# Patient Record
Sex: Female | Born: 1970 | Race: White | Hispanic: No | Marital: Married | Smoking: Never smoker
Health system: Southern US, Community
[De-identification: ages and names within clinical notes are randomized; demographics above are authoritative.]

## PROBLEM LIST (undated history)

## (undated) DIAGNOSIS — G5620 Lesion of ulnar nerve, unspecified upper limb: Secondary | ICD-10-CM

## (undated) DIAGNOSIS — D649 Anemia, unspecified: Secondary | ICD-10-CM

## (undated) DIAGNOSIS — F319 Bipolar disorder, unspecified: Secondary | ICD-10-CM

## (undated) DIAGNOSIS — R000 Tachycardia, unspecified: Secondary | ICD-10-CM

## (undated) DIAGNOSIS — E785 Hyperlipidemia, unspecified: Secondary | ICD-10-CM

## (undated) DIAGNOSIS — F329 Major depressive disorder, single episode, unspecified: Secondary | ICD-10-CM

## (undated) DIAGNOSIS — G5610 Other lesions of median nerve, unspecified upper limb: Secondary | ICD-10-CM

## (undated) DIAGNOSIS — E28319 Asymptomatic premature menopause: Secondary | ICD-10-CM

## (undated) DIAGNOSIS — F419 Anxiety disorder, unspecified: Secondary | ICD-10-CM

## (undated) DIAGNOSIS — B019 Varicella without complication: Secondary | ICD-10-CM

## (undated) DIAGNOSIS — M5412 Radiculopathy, cervical region: Secondary | ICD-10-CM

## (undated) DIAGNOSIS — E860 Dehydration: Secondary | ICD-10-CM

## (undated) DIAGNOSIS — F32A Depression, unspecified: Secondary | ICD-10-CM

## (undated) HISTORY — DX: Radiculopathy, cervical region: M54.12

## (undated) HISTORY — DX: Lesion of ulnar nerve, unspecified upper limb: G56.20

## (undated) HISTORY — DX: Depression, unspecified: F32.A

## (undated) HISTORY — DX: Anemia, unspecified: D64.9

## (undated) HISTORY — DX: Tachycardia, unspecified: R00.0

## (undated) HISTORY — DX: Asymptomatic premature menopause: E28.319

## (undated) HISTORY — DX: Hyperlipidemia, unspecified: E78.5

## (undated) HISTORY — DX: Dehydration: E86.0

## (undated) HISTORY — PX: UPPER GASTROINTESTINAL ENDOSCOPY: SHX188

## (undated) HISTORY — PX: BUNIONECTOMY: SHX129

## (undated) HISTORY — PX: APPENDECTOMY: SHX54

## (undated) HISTORY — DX: Varicella without complication: B01.9

## (undated) HISTORY — DX: Other lesions of median nerve, unspecified upper limb: G56.10

## (undated) HISTORY — DX: Anxiety disorder, unspecified: F41.9

## (undated) HISTORY — DX: Bipolar disorder, unspecified: F31.9

---
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
## 1990-11-06 HISTORY — PX: SHOULDER SURGERY: SHX246

## 2000-09-12 ENCOUNTER — Encounter (INDEPENDENT_AMBULATORY_CARE_PROVIDER_SITE_OTHER): Payer: Self-pay | Admitting: Specialist

## 2000-09-12 ENCOUNTER — Ambulatory Visit (HOSPITAL_COMMUNITY): Admission: RE | Admit: 2000-09-12 | Discharge: 2000-09-12 | Payer: Self-pay | Admitting: Gastroenterology

## 2000-11-06 HISTORY — PX: FOOT SURGERY: SHX648

## 2002-03-18 ENCOUNTER — Other Ambulatory Visit: Admission: RE | Admit: 2002-03-18 | Discharge: 2002-03-18 | Payer: Self-pay | Admitting: Obstetrics and Gynecology

## 2003-01-22 ENCOUNTER — Inpatient Hospital Stay (HOSPITAL_COMMUNITY): Admission: AD | Admit: 2003-01-22 | Discharge: 2003-01-22 | Payer: Self-pay | Admitting: Obstetrics and Gynecology

## 2003-02-16 ENCOUNTER — Inpatient Hospital Stay (HOSPITAL_COMMUNITY): Admission: AD | Admit: 2003-02-16 | Discharge: 2003-02-19 | Payer: Self-pay | Admitting: Obstetrics and Gynecology

## 2003-02-16 ENCOUNTER — Encounter (INDEPENDENT_AMBULATORY_CARE_PROVIDER_SITE_OTHER): Payer: Self-pay

## 2003-02-20 ENCOUNTER — Encounter: Admission: RE | Admit: 2003-02-20 | Discharge: 2003-03-22 | Payer: Self-pay | Admitting: Obstetrics and Gynecology

## 2003-03-30 ENCOUNTER — Other Ambulatory Visit: Admission: RE | Admit: 2003-03-30 | Discharge: 2003-03-30 | Payer: Self-pay | Admitting: Obstetrics and Gynecology

## 2003-11-23 ENCOUNTER — Ambulatory Visit (HOSPITAL_COMMUNITY): Admission: RE | Admit: 2003-11-23 | Discharge: 2003-11-23 | Payer: Self-pay | Admitting: Gastroenterology

## 2003-11-27 ENCOUNTER — Ambulatory Visit (HOSPITAL_COMMUNITY): Admission: RE | Admit: 2003-11-27 | Discharge: 2003-11-27 | Payer: Self-pay | Admitting: Gastroenterology

## 2003-12-01 ENCOUNTER — Ambulatory Visit (HOSPITAL_COMMUNITY): Admission: RE | Admit: 2003-12-01 | Discharge: 2003-12-01 | Payer: Self-pay | Admitting: Gastroenterology

## 2004-01-01 ENCOUNTER — Ambulatory Visit (HOSPITAL_COMMUNITY): Admission: RE | Admit: 2004-01-01 | Discharge: 2004-01-01 | Payer: Self-pay | Admitting: General Surgery

## 2004-02-22 ENCOUNTER — Inpatient Hospital Stay (HOSPITAL_COMMUNITY): Admission: RE | Admit: 2004-02-22 | Discharge: 2004-03-10 | Payer: Self-pay | Admitting: Psychiatry

## 2004-03-14 ENCOUNTER — Other Ambulatory Visit (HOSPITAL_COMMUNITY): Admission: RE | Admit: 2004-03-14 | Discharge: 2004-03-28 | Payer: Self-pay | Admitting: Psychiatry

## 2004-05-12 ENCOUNTER — Encounter (INDEPENDENT_AMBULATORY_CARE_PROVIDER_SITE_OTHER): Payer: Self-pay | Admitting: Specialist

## 2004-05-12 ENCOUNTER — Ambulatory Visit (HOSPITAL_COMMUNITY): Admission: RE | Admit: 2004-05-12 | Discharge: 2004-05-13 | Payer: Self-pay | Admitting: General Surgery

## 2004-07-21 ENCOUNTER — Other Ambulatory Visit: Admission: RE | Admit: 2004-07-21 | Discharge: 2004-07-21 | Payer: Self-pay | Admitting: Obstetrics and Gynecology

## 2004-09-20 ENCOUNTER — Ambulatory Visit (HOSPITAL_COMMUNITY): Admission: RE | Admit: 2004-09-20 | Discharge: 2004-09-20 | Payer: Self-pay | Admitting: Cardiology

## 2004-11-06 HISTORY — PX: CHOLECYSTECTOMY: SHX55

## 2005-02-16 ENCOUNTER — Inpatient Hospital Stay (HOSPITAL_COMMUNITY): Admission: AD | Admit: 2005-02-16 | Discharge: 2005-02-16 | Payer: Self-pay | Admitting: Obstetrics and Gynecology

## 2005-03-27 ENCOUNTER — Inpatient Hospital Stay (HOSPITAL_COMMUNITY): Admission: AD | Admit: 2005-03-27 | Discharge: 2005-03-27 | Payer: Self-pay | Admitting: Obstetrics and Gynecology

## 2005-04-07 ENCOUNTER — Inpatient Hospital Stay (HOSPITAL_COMMUNITY): Admission: AD | Admit: 2005-04-07 | Discharge: 2005-04-08 | Payer: Self-pay | Admitting: Obstetrics & Gynecology

## 2005-05-05 ENCOUNTER — Inpatient Hospital Stay (HOSPITAL_COMMUNITY): Admission: AD | Admit: 2005-05-05 | Discharge: 2005-05-05 | Payer: Self-pay | Admitting: Obstetrics & Gynecology

## 2005-05-11 ENCOUNTER — Inpatient Hospital Stay (HOSPITAL_COMMUNITY): Admission: RE | Admit: 2005-05-11 | Discharge: 2005-05-13 | Payer: Self-pay | Admitting: Obstetrics and Gynecology

## 2005-06-23 ENCOUNTER — Other Ambulatory Visit: Admission: RE | Admit: 2005-06-23 | Discharge: 2005-06-23 | Payer: Self-pay | Admitting: Obstetrics and Gynecology

## 2006-11-13 ENCOUNTER — Ambulatory Visit: Payer: Self-pay | Admitting: Family Medicine

## 2007-07-04 ENCOUNTER — Telehealth (INDEPENDENT_AMBULATORY_CARE_PROVIDER_SITE_OTHER): Payer: Self-pay | Admitting: *Deleted

## 2007-07-05 ENCOUNTER — Ambulatory Visit: Payer: Self-pay | Admitting: Family Medicine

## 2007-09-30 ENCOUNTER — Telehealth (INDEPENDENT_AMBULATORY_CARE_PROVIDER_SITE_OTHER): Payer: Self-pay | Admitting: *Deleted

## 2008-11-06 HISTORY — PX: BREAST LUMPECTOMY: SHX2

## 2009-12-07 LAB — HM MAMMOGRAPHY

## 2009-12-17 ENCOUNTER — Encounter: Admission: RE | Admit: 2009-12-17 | Discharge: 2009-12-17 | Payer: Self-pay | Admitting: Obstetrics and Gynecology

## 2010-01-07 ENCOUNTER — Encounter: Admission: RE | Admit: 2010-01-07 | Discharge: 2010-01-07 | Payer: Self-pay | Admitting: General Surgery

## 2010-01-07 ENCOUNTER — Ambulatory Visit (HOSPITAL_BASED_OUTPATIENT_CLINIC_OR_DEPARTMENT_OTHER): Admission: RE | Admit: 2010-01-07 | Discharge: 2010-01-07 | Payer: Self-pay | Admitting: General Surgery

## 2010-11-06 HISTORY — PX: TONSILLECTOMY AND ADENOIDECTOMY: SUR1326

## 2010-11-29 ENCOUNTER — Other Ambulatory Visit: Payer: Self-pay | Admitting: Obstetrics and Gynecology

## 2010-11-29 DIAGNOSIS — Z1239 Encounter for other screening for malignant neoplasm of breast: Secondary | ICD-10-CM

## 2010-11-29 DIAGNOSIS — Z1231 Encounter for screening mammogram for malignant neoplasm of breast: Secondary | ICD-10-CM

## 2010-12-14 ENCOUNTER — Ambulatory Visit: Payer: Self-pay

## 2010-12-15 ENCOUNTER — Ambulatory Visit
Admission: RE | Admit: 2010-12-15 | Discharge: 2010-12-15 | Disposition: A | Discharge status: Home / Self Care | Payer: BC Managed Care – PPO | Source: Ambulatory Visit | Attending: Obstetrics and Gynecology | Admitting: Obstetrics and Gynecology

## 2010-12-15 DIAGNOSIS — Z1231 Encounter for screening mammogram for malignant neoplasm of breast: Secondary | ICD-10-CM

## 2011-01-29 LAB — CBC
Hemoglobin: 13.2 g/dL (ref 12.0–15.0)
Platelets: 243 10*3/uL (ref 150–400)
RDW: 14.1 % (ref 11.5–15.5)

## 2011-01-29 LAB — DIFFERENTIAL
Basophils Absolute: 0 10*3/uL (ref 0.0–0.1)
Lymphocytes Relative: 33 % (ref 12–46)
Monocytes Absolute: 0.5 10*3/uL (ref 0.1–1.0)
Neutro Abs: 3.5 10*3/uL (ref 1.7–7.7)
Neutrophils Relative %: 57 % (ref 43–77)

## 2011-01-29 LAB — BASIC METABOLIC PANEL
Calcium: 9.6 mg/dL (ref 8.4–10.5)
Creatinine, Ser: 1.01 mg/dL (ref 0.4–1.2)
GFR calc non Af Amer: >60 mL/min (ref >60)
Glucose, Bld: 96 mg/dL (ref 70–99)
Sodium: 137 mEq/L (ref 135–145)

## 2011-01-31 ENCOUNTER — Other Ambulatory Visit (INDEPENDENT_AMBULATORY_CARE_PROVIDER_SITE_OTHER): Payer: Self-pay | Admitting: Otolaryngology

## 2011-01-31 ENCOUNTER — Ambulatory Visit (HOSPITAL_BASED_OUTPATIENT_CLINIC_OR_DEPARTMENT_OTHER)
Admission: RE | Admit: 2011-01-31 | Discharge: 2011-01-31 | Disposition: A | Discharge status: Home / Self Care | Payer: BC Managed Care – PPO | Source: Ambulatory Visit | Attending: Otolaryngology | Admitting: Otolaryngology

## 2011-01-31 DIAGNOSIS — J3501 Chronic tonsillitis: Secondary | ICD-10-CM | POA: Insufficient documentation

## 2011-01-31 LAB — POCT HEMOGLOBIN-HEMACUE: Hemoglobin: 14.2 g/dL (ref 12.0–15.0)

## 2011-02-15 NOTE — Op Note (Signed)
NAMESARANN, TREGRE                  ACCOUNT NO.:  [id number]  MEDICAL RECORD NO.:  [id number]            PATIENT TYPE:  LOCATION:                                 FACILITY:  PHYSICIAN:  Newman Pies, MD                 DATE OF BIRTH:  DATE OF PROCEDURE:  01/31/2011 DATE OF DISCHARGE:                              OPERATIVE REPORT   SURGEON:  Newman Pies, MD  PREOPERATIVE DIAGNOSES: 1. Chronic tonsillitis/pharyngitis. 2. Tonsillar hypertrophy.  POSTOPERATIVE DIAGNOSES: 1. Chronic tonsillitis/pharyngitis. 2. Adenotonsillar hypertrophy.  PROCEDURE PERFORMED:  Adenotonsillectomy  ANESTHESIA:  General endotracheal tube anesthesia.  COMPLICATIONS:  None.  ESTIMATED BLOOD LOSS:  Minimal.  INDICATIONS FOR PROCEDURE:  The patient is a 40 year old female with a history of chronic sore throat and frequent tonsillolith production. The patient also complains of chronic halitosis.  The patient was previously diagnosed as having multiple recurrent strep infections and was treated with multiple courses of antibiotics over the past year.  On examination, the patient was noted to have bilateral cryptic tonsils, with numerous tonsilloliths within the tonsillar fossa.  Based on the above findings, the decision was made for the patient to undergo tonsillectomy and possible adenoidectomy.  The risks, benefits, alternatives, and details of the procedure were discussed with the patient.  Questions were invited and answered.  Informed consent was obtained.  DESCRIPTION:  The patient was taken to the operating room and placed supine on the operating table.  General endotracheal tube anesthesia was administered by the anesthesiologist.  The patient was positioned and prepped and draped in a standard fashion for tonsillectomy.  A Crowe- Davis mouth gag was inserted into the oral cavity for exposure.  2+ cryptic tonsils were noted bilaterally.  No submucous cleft or bifidity was noted.  Indirect mirror  examination of the nasal pharynx revealed moderate residual adenoid tissue.  The adenoid was completely ablated with the coblator device.  Hemostasis was also achieved with the coblator device.  The right tonsil was then grasped with a straight Allis clamp and retracted medially.  It was resected free from the underlying pharyngeal constrictor muscles with the coblator device.  The same procedure was repeated on the left side without exception.  The surgical sites were copiously irrigated.  The care of the patient was turned over to the mouth by anesthesiologist.  The patient was awakened from anesthesia without difficulty.  She was extubated and transferred to the recovery room in good condition.  OPERATIVE FINDINGS:  Adenotonsillar hypertrophy, with numerous tonsilloliths within the tonsillar fossa.  SPECIMEN:  Bilateral tonsils.  FOLLOWUP CARE:  The patient will be discharged home once she is awake and alert.  She will be placed on Roxicet 5-10 mL p.o. q.4-6 h. p.r.n. pain, and amoxicillin 600 mg p.o. b.i.d. for 5 days.  The patient will follow up in my office in approximately 2 weeks.     Newman Pies, MD     ST/MEDQ  D:  01/31/2011  T:  02/01/2011  Job:  [id number]  Electronically Signed by Newman Pies MD on  02/15/2011 11:32:59 AM

## 2011-03-24 NOTE — Op Note (Signed)
[place]  Patient:    Alison Bradley, Alison Bradley                           MRN: [id number] Proc. Date: 09/12/00 Adm. Date:  [id number] Attending:  Charna Elizabeth                           Operative Report  DATE OF BIRTH:  1971/10/23  EFERRING PHYSICIAN:  PROCEDURE PERFORMED:  Esophagogastroduodenoscopy with biopsies.  ENDOSCOPIST:  Anselmo Rod, M.D.  INSTRUMENT USED:  Olympus video panendoscope.  INDICATIONS FOR PROCEDURE:  History of melenic stools in a 40 year old white female rule out peptic ulcer disease, esophagitis, gastritis, etc.  PREPROCEDURE PREPARATION:  Informed consent was procured from the patient. The patient was fasted for eight hours prior to the procedure.  PREPROCEDURE PHYSICAL:  The patient had stable vital signs.  Neck supple. Chest clear to auscultation.  S1, S2 regular.  Abdomen soft with normal abdominal bowel sounds.  DESCRIPTION OF PROCEDURE:  The patient was placed in left lateral decubitus position and sedated with 50 mg of Demerol and 6 mg of Versed intravenously. Once the patient was adequately sedated and maintained on low-flow oxygen and continuous cardiac monitoring, the Olympus video panendoscope was advanced through the mouthpiece, over the tongue, into the esophagus under direct vision.  The entire esophagus appeared normal without evidence of ring, stricture, masses, lesions, esophagitis or Barretts mucosa.  The scope was then advanced to the stomach.  A small hiatal hernia was seen in high retroflexion.  There was ____________ gastritis with areas of erythema in the midbody of the stomach.  Biopsies were done from this area to rule out presence of Helicobacter pylori by pathology.  The proximal small bowel appeared normal.  No frank ulcers were seen.  A small amount of liquid debris in the stomach even though the patient had consumed her last meal over 12 hours ago.  IMPRESSION: 1. Patchy gastritis, biopsies  done for Helicobacter pylori. 2. Small hiatal hernia. 3. Normal esophagus and proximal bowel. 4. No ulcers seen.  RECOMMENDATION: 1. Continue Nexium for now. 2. Treat with antibiotics if Helicobacter pylori present on biopsies. 3. Avoid all nonsteroidals. 4. Outpatient follow-up in the next two weeks. DD:  09/13/00 TD:  09/13/00 Job: [id number]

## 2011-03-24 NOTE — H&P (Signed)
Alison Bradley, Alison Bradley                              ACCOUNT NO.:  [id number]   MEDICAL RECORD NO.:  [id number]                   PATIENT TYPE:  OIB   LOCATION:  2550                                 FACILITY:  [facility]   PHYSICIAN:  Adolph Pollack, M.D.            DATE OF BIRTH:  June 05, 1971   DATE OF ADMISSION:  05/12/2004  DATE OF DISCHARGE:                                HISTORY & PHYSICAL   REASON FOR ADMISSION:  Elective cholecystectomy.   HISTORY OF PRESENT ILLNESS:  This is a 40 year old female who I first saw  back in February at the request of Dr. Anselmo Rod.  She had epigastric  pain, bloating, and she was placed on Aciphex and Reglan but the pain  continued to come.  Gallbladder ultrasound was normal.  A nuclear medicine  hepatobiliary scan with ejection fraction demonstrates a borderline  gallbladder ejection fraction of 30% which is low normal.  A gastric  emptying scan was somewhat delayed.  She has been monitored and I saw her a  month later.  She continued to have postprandial epigastric pain and nausea.   We discussed the laparoscopic cholecystectomy being done for gallbladder  dysfunction and I went over the procedure and the risks.  In the interim,  she had an acute exacerbation of her bipolar disorder requiring  hospitalization but now has improved after medication and presents for her  elective procedure.   PAST MEDICAL HISTORY:  Bipolar disorder.   PAST SURGICAL HISTORY:  1. Cesarean section.  2. Left shoulder surgery.  3. Left wrist surgery.   ALLERGIES:  CT DYE.   MEDICATIONS:  1. Lithium 300 mg q.a.m. and 600 mg q.p.m.  2. Seroquel 100 mg t.i.d. and 200 mg q.h.s.  3. Wellbutrin 150 mg q.a.m.  4. Trileptal 100 mg q.a.m.  5. Lamictal 50 mg q.a.m.   SOCIAL HISTORY:  She is married.  Denies tobacco use.  Has one or two drinks  a month.  She works at [employer].   PHYSICAL EXAMINATION:  GENERAL:  A well-developed, well-nourished  female in  no acute distress.  Pleasant and cooperative.  VITAL SIGNS:  Temperature is 97.5, blood pressure 115/70, pulse 87.  HEENT:  Extraocular movements intact.  No icterus.  NECK:  Supple without obvious palpable masses.  LUNGS:  Breath sounds equal and clear.  CARDIOVASCULAR:  Heart demonstrates a regular rate and rhythm.  No murmur  heard.  EXTREMITIES:  No lower extremity edema.  ABDOMEN:  Soft, nontender, and nondistended.  Lower transverse scar noted.   IMPRESSION:  Gallbladder dysfunction/biliary dyskinesia.   PLAN:  Laparoscopic cholecystectomy.  Adolph Pollack, M.D.    Kari Baars  D:  05/12/2004  T:  05/12/2004  Job:  [id number]

## 2011-03-24 NOTE — Op Note (Signed)
Alison Bradley, Alison Bradley                              ACCOUNT NO.:  [id number]   MEDICAL RECORD NO.:  [id number]                   PATIENT TYPE:  AMB   LOCATION:  ENDO                                 FACILITY:  [facility]   PHYSICIAN:  Anselmo Rod, M.D.               DATE OF BIRTH:  [birth year]   DATE OF PROCEDURE:  11/23/2003  DATE OF DISCHARGE:  11/23/2003                                 OPERATIVE REPORT   PROCEDURE PERFORMED:  Esophagogastroduodenoscopy.   ENDOSCOPIST:  Charna Elizabeth, M.D.   INSTRUMENT USED:  Olympus video panendoscope.   INDICATIONS FOR PROCEDURE:  Epigastric pain with nausea and vomiting in a 40-  year-old white female, rule out peptic ulcer disease, esophagitis,  gastritis, etc.   PREPROCEDURE PREPARATION:  Informed consent was procured from the patient.  The patient was fasted for eight hours prior to the procedure.   PREPROCEDURE PHYSICAL:  The patient had stable vital signs.  Neck supple,  chest clear to auscultation.  S1, S2 regular.  Abdomen soft with normal  bowel sounds.  Epigastric tenderness on palpation, no guarding rebound, no  rigidity, no hepatosplenomegaly.   DESCRIPTION OF PROCEDURE:  The patient was placed in the left lateral  decubitus position and sedated with 80 mg of Demerol and 8 mg of Versed  intravenously.  Once the patient was adequately sedated and maintained on  low-flow oxygen and continuous cardiac monitoring, the Olympus video  panendoscope was advanced through the mouth piece over the tongue into the  esophagus under direct vision.  The entire esophagus appeared normal with no  evidence of ring, stricture, masses, esophagitis or Barrett's mucosa.  The  scope was then advanced to the stomach.  There was a large amount of debris  seen in the antrum which may indicate a component of gastroparesis.  Retroflexion in the high cardia revealed no abnormalities.  Proximal small  bowel appeared normal.  No erosions, ulcerations, masses or polyps  were  seen.  Visualization of the mucosa underlying the debris in the stomach was  not possible in spite of several efforts to do so.  The patient tolerated  the procedure well without complications.  There was no evidence of outlet  obstruction.   IMPRESSION:  1. Normal-appearing esophagus and proximal small bowel.  2. Large amount of debris in the antrum, question gastroparesis.   RECOMMENDATIONS:  1. Schedule ultrasound and HIDA scan with CCK injection and a gastric     emptying study to further evaluate her     symptoms.  2. Continue proton pump inhibitors for now.  Outpatient followup after the     above mentioned test results have been procured.  Anselmo Rod, M.D.    JNM/MEDQ  D:  12/29/2003  T:  12/29/2003  Job:  [id number]   cc:   Leanne Chang, M.D.  [address]  [place]  [place] [address]  Fax: [phone]

## 2011-03-24 NOTE — Op Note (Signed)
Alison Bradley, Alison Bradley                              ACCOUNT NO.:  [id number]   MEDICAL RECORD NO.:  [id number]                   PATIENT TYPE:  OIB   LOCATION:  5707                                 FACILITY:  [facility]   PHYSICIAN:  Adolph Pollack, M.D.            DATE OF BIRTH:  December 08, 1970   DATE OF PROCEDURE:  05/12/2004  DATE OF DISCHARGE:                                 OPERATIVE REPORT   PREOPERATIVE DIAGNOSIS:  Biliary dyskinesia.   POSTOPERATIVE DIAGNOSIS:  Biliary dyskinesia.   OPERATION PERFORMED:  Laparoscopic cholecystectomy with intraoperative  cholangiogram.   SURGEON:  Adolph Pollack, M.D.   ANESTHESIA:  General.   ASSISTANT:  Abigail Miyamoto, M.D.   INDICATIONS FOR PROCEDURE:  This is a 40 year old female with postprandial  epigastric pain, nausea and vomiting and a borderline ejection fraction.  She now presents for elective cholecystectomy for gallbladder dysfunction.  The procedure and the risks have been discussed with her preoperatively.   DESCRIPTION OF PROCEDURE:  She was seen in the holding area, then brought to  the operating room and placed supine on the operating table and a general  anesthetic was administered.  Her abdominal wall was then sterilely prepped  and draped.  Dilute Marcaine solution was then infiltrated for local  anesthetic in the subumbilical region and a subumbilical incision was made  through the skin and subcutaneous tissue, fascia and peritoneum under direct  vision.  A pursestring suture of 0 Vicryl was placed around the fascial  edges.  A Hasson trocar was introduced into the peritoneal cavity.  Pneumoperitoneum was created by insufflation with CO2 gas.   Next, a laparoscope was introduced and she was placed in reverse  Trendelenburg position with the right side tilted slightly upward.  An 11 mm  trocar was placed through an epigastric incision and two 5 mm trocars placed  in the right midabdomen.  The fundus of the gallbladder  was grasped.  No  acute inflammatory changes or dense adhesions were noted.  The fundus was  retracted toward the right shoulder.  The infundibulum was grasped and  mobilized bluntly and using slight cautery.  I isolated the cystic duct and  created a window around it.  A clip was placed just above the cystic duct  gallbladder junction and a small incision made in the cystic duct.  A  cholangiocatheter was passed through the anterior abdominal wall into the  cystic duct and the cholangiogram was performed.   Under real time fluoroscopy, dilute contrast material was injected into the  cystic duct which was of moderate length.  The common hepatic, right and  left hepatic and common bile ducts all filled and common bile duct emptied,  drained the contrast promptly to the duodenum without obvious evidence of  obstruction.  Final reports pending radiologist's interpretation.  Cholangiocatheter was removed, the cystic duct was then clipped three times  on the  staying end side and divided.  The anterior branch of the cystic  artery was clipped and divided and then the posterior branch was identified,  clipped and divided.  The gallbladder was dissected free from the liver bed  intact with electrocautery.  It was then removed through the subumbilical  port.  Under laparoscopic vision, the subumbilical fascial defect was closed  by tightening up and tying down the pursestring suture.   The perihepatic area was irrigated, the gallbladder fossa irrigated, no  bleeding or bile leakage was noted.  The irrigating fluid was evacuated.  The remaining trocars were then removed and the pneumoperitoneum was then  released.  The skin incisions were closed with 4-0 Monocryl subcuticular  stitches.  Steri-Strips and sterile dressings were applied.  The patient  tolerated the procedure well without any apparent complications and was  taken to the recovery room in satisfactory condition.                                                Adolph Pollack, M.D.    Kari Baars  D:  05/12/2004  T:  05/12/2004  Job:  [id number]   cc:   Anselmo Rod, M.D.  [address]  [address]  [place]  [place] [address]  Fax: [phone]   Leanne Chang, M.D.  [address]  [place]  [place] [address]  Fax: [phone]

## 2011-03-24 NOTE — Op Note (Signed)
Alison Bradley, FLORENCE                              ACCOUNT NO.:  [id number]   MEDICAL RECORD NO.:  [id number]                   PATIENT TYPE:  INP   LOCATION:  9109                                 FACILITY:  [facility]   PHYSICIAN:  Juluis Mire, M.D.                DATE OF BIRTH:  Oct 31, 1971   DATE OF PROCEDURE:  02/16/2003  DATE OF DISCHARGE:                                 OPERATIVE REPORT   PREOPERATIVE DIAGNOSES:  Intrauterine pregnancy at 36-1/2 weeks with  spontaneous onset of labor and breech presentation.   POSTOPERATIVE DIAGNOSES:  Intrauterine pregnancy at 36-1/2 weeks with  spontaneous onset of labor and breech presentation.   PROCEDURE:  Low transverse cesarean section.   SURGEON:  Juluis Mire, M.D.   ANESTHESIA:  Spinal.   ESTIMATED BLOOD LOSS:  600 mL.   PACKS AND DRAINS:  None.   INTRAOPERATIVE BLOOD REPLACEMENT:  None.   COMPLICATIONS:  None.   INDICATIONS:  The patient is a 40 year old primigravida married white  female, who presents at 36+ weeks with regular uterine activity.  Documented  cervical change was noted.  Ultrasound did reveal a breech presentation.  The decision was to proceed with primary cesarean section.  The risks were  discussed, including fetal prematurity.  The risk of infection.  The  risk  of hemorrhage could necessitate transfusion with the risk of AIDS or  hepatitis.  Risk of injury to adjacent organs, including bladder, bowel, and  ureters and could require further exploratory surgery.  The risk of deep  venous thrombosis and pulmonary embolus.   PROCEDURE:  The patient was taken to the OR and placed in the supine  position with left lateral tilt.  After a satisfactory level of spinal  anesthesia was obtained, the abdomen was prepped with Betadine and draped in  a sterile field.  A low transverse skin incision was made with the knife and  carried through the subcutaneous tissue.  The fascia was entered sharply in  the incision and  the fascia extended laterally.  The fascia was taken off  the muscles superiorly and inferiorly.  The rectus muscles were separated in  the midline.  The anterior peritoneum was entered sharply.  The incision in  the peritoneum was extended both superiorly and inferiorly.  A low  transverse bladder flap was developed.  A low transverse uterine incision  was begun with a knife and was extended laterally using manual traction.  The infant presented in the classic breech presentation and was delivered in  the usual manner.  The infant was a viable female who weighed 6 lb, 10 oz.  Apgars were 7/9.  The umbilical cord pH was 7.31.  The placenta was  delivered manually.  The uterus was wiped clean of remaining membranes and  placenta.  The uterus was closed with interrupted sutures of 0 chromic in a  two-layer  closure technique.  We had good hemostasis and clear urine output.  The tubes and ovaries were unremarkable.  We irrigated the pelvis and noted  good hemostasis.  The muscles were reapproximated with running suture of 3-0  Vicryl.  The fascia was closed with running suture of 0 PDS.  The skin  was closed with staples and Steri-Strips.  Sponge, instrument, and needle  counts were reported as correct by circulating nurse x2.  The Foley catheter  remained clear at the time of closure.  The patient tolerated the procedure  well and was returned to the recovery room in good condition.                                               Juluis Mire, M.D.    JSM/MEDQ  D:  02/16/2003  T:  02/17/2003  Job:  [id number]

## 2011-03-24 NOTE — Discharge Summary (Signed)
NAME:  Alison Bradley, Alison Bradley                              ACCOUNT NO.:  [id number]   MEDICAL RECORD NO.:  [id number]                   PATIENT TYPE:  IPS   LOCATION:  0301                                 FACILITY:  [facility]   PHYSICIAN:  Jeanice Lim, M.D.              DATE OF BIRTH:  09/17/1971   DATE OF ADMISSION:  02/22/2004  DATE OF DISCHARGE:  03/10/2004                                 DISCHARGE SUMMARY   IDENTIFYING DATA:  This is a 40 year old married Caucasian female,  voluntarily admitted, with a history of bipolar disorder, brought to the  hospital by her husband, having erratic behavior, staying up at night,  wakening him to tell him that Prudy Feeler was in the house, crying, believes  supervisor has organized a conspiracy against her and working to put gas in  vents or poison in water.  Had thoughts of killing herself, feeling that she  is a bad mother and things are hopeless.   ADMISSION MEDICATIONS:  Seroquel, Xanax which she had not been compliant  with, and previously on Zyprexa, lithium, Depakote.   ALLERGIES:  SHELLFISH.  No known drug allergies.   PHYSICAL EXAMINATION:  Superficial scratches on left arm, otherwise  essentially within normal limits, neurologically nonfocal.   ROUTINE ADMISSION LABS:  Within normal limits.   MENTAL STATUS EXAM:  Fully alert, tearful, labile affect, grossly full motor  functions.  Speech hyperverbal, rapid pace.  Mood guarded, agitated, mixed  state.  Thought processes positive agitation, paranoid, suicidal ideation  with plan to cut off arm and bleed to death.  Cognitively intact.  Judgment  and insight poor.   ADMISSION DIAGNOSES:   AXIS I:  Bipolar disorder, mixed state, with psychotic features.   AXIS II:  Deferred.   AXIS III:  None.   AXIS IV:  Moderate stressors related to support group and occupation.   AXIS V:  25/75.   HOSPITAL COURSE:  The patient was admitted and ordered routine p.r.n.  medications, underwent further  monitoring, and was encouraged to participate  in individual, group and milieu therapy, was placed on safety checks, given  Geodon IM and then started on 60 mg b.i.d. as well as lithium due to  severity of psychosis.  The patient reported feeling quite delusional,  paranoid, agitated, labile, attempted to hurt herself, had to be placed on  one to one.  However she also reported wanting to be more gradual with  medications and felt over sedated and refused medications until they were  decreased and changed, therefore medications were changed despite the  severity of the patient's condition and the patient then a day or two later  reported she was willing to go back on previous medications, specifically  lithium which she clearly required due to the severity of her condition.  The patient gradually adjusted to medications, at times appeared to have  slurred speech, somewhat over sedated,  but stopped cutting on herself and  lability as well as depressive symptoms, paranoia, delusional thoughts all  gradually decreased to the point that she was clearly showing response to  medications, tolerating them without side effects, sleeping well, reporting  more stable mood, hopeful, reporting no hallucinations and decreased  distorted thoughts but still somewhat suspicious about work situation but  felt that it may be more office politics than any kind of conspiracy.  She  participated in aftercare planning.  Family was involved, husband was  involved in giving treatment feedback during stabilization phase, as well as  history gathering.  The patient was given medication education.   DISCHARGE MEDICATIONS:  1. Trazodone 100 mg q.h.s.  2. Seroquel 25 mg 3 t.i.d. and 6 at bedtime.  3. Ambien 10 mg q.h.s.  4. Xanax 1 mg q.a.m., 1/2 at 1, 1/2 at 6 and 1 q.h.s.  The patient had been     on up to 1 mg 3 or 4 times a day although had not been taking this     consistently prior to admission, quite  ambivalent about medicines.  5. Claritin 10 mg q.a.m.  6. Lithobid 300 mg 1 q.a.m. and 2 q.h.s.  7. Zyprexa 5 mg q.a.m., q.1 p.m. and 3 q.h.s.   DISPOSITION:  The patient was discharged to follow up with Len Blalock  after completing Intensive Outpatient Program which she would start on  Monday for close follow-up.   DISCHARGE DIAGNOSES:   AXIS I:  Bipolar disorder, mixed state, with psychotic features.   AXIS II:  Deferred.   AXIS III:  None.   AXIS IV:  Moderate stressors related to support group and occupation.   AXIS V:  Global assessment of function on discharge was 50-55.                                               Jeanice Lim, M.D.    JEM/MEDQ  D:  03/24/2004  T:  03/25/2004  Job:  [id number]

## 2011-03-24 NOTE — Discharge Summary (Signed)
Alison Bradley, Alison Bradley                              ACCOUNT NO.:  [id number]   MEDICAL RECORD NO.:  [id number]                   PATIENT TYPE:  INP   LOCATION:  9109                                 FACILITY:  [facility]   PHYSICIAN:  Freddy Finner, M.D.                DATE OF BIRTH:  1970-12-16   DATE OF ADMISSION:  02/16/2003  DATE OF DISCHARGE:  02/19/2003                                 DISCHARGE SUMMARY   ADMITTING DIAGNOSES:  1. Intrauterine pregnancy at 36-and-a-half weeks estimated gestational age.  2. Breech presentation.  3. Spontaneous onset of labor.   DISCHARGE DIAGNOSES:  1. Status post low transverse cesarean section.  2. Viable female infant.   PROCEDURE:  Primary low transverse cesarean section.   REASON FOR ADMISSION:  Please see written H&P.   HOSPITAL COURSE:  The patient is a 40 year old primigravida that presented  to [facility] at 36-and-a-half weeks estimated gestational  age in labor.  Documented cervical changes were noted.  Ultrasound revealed  fetus was in the breech presentation.  The patient was prepped accordingly  and taken to the operating room where spinal anesthesia was administered  without difficulty.  A low transverse incision was made with delivery of a  viable female infant weighing 6 pounds 10 ounces with Apgars of 7 at one  minute and 9 at five minutes.  Umbilical cord pH was 7.31.  The patient  tolerated the procedure well and was taken to the recovery room in stable  condition.  On postoperative day #1 the patient was doing well.  She had had  vomiting during the night; however she was not nauseated on the following  morning.  Fundus was firm and nontender.  She had good return of bowel  function.  Abdominal dressing was clean, dry, and intact.  Labs revealed  hemoglobin of 12.4; platelet count of 204,000; wbc count 20.4.  On  postoperative day #2 the patient had no further nausea and vomiting.  Vital  signs were stable.   Temperature was 99.3 at 4 a.m.; however, it was afebrile  at  8 a.m.  Fundus was firm and nontender.  Abdominal dressing was removed  revealing an incision that was clean, dry, and intact.  On postoperative day  #3 the patient complained of some incisional soreness.  Vital signs were  stable; she was afebrile.  Fundus was firm and nontender.  Incision was  clean, dry, and intact with slight erythema that was noted superior to the  incisional site at the midpoint without induration.  Staples were removed  and the patient was discharged home.   CONDITION ON DISCHARGE:  Good.   DIET:  Regular as tolerated.   ACTIVITY:  No heavy lifting, no driving x2 weeks, no vaginal entry.   FOLLOW-UP:  The patient is to follow up in the office in one week for an  incision  check.  She is to call for temperature greater than 100 degrees,  persistent nausea and vomiting, redness or drainage of the incisional site,  or heavy vaginal bleeding.    DISCHARGE MEDICATIONS:  1. Percocet 5/325 #30 one p.o. q.4-6h. p.r.n. pain.  2. Motrin 600 mg q.6h. p.r.n.  3. Keflex 500 mg one p.o. t.i.d.  4. Prenatal vitamins one p.o. daily.  5. Colace one p.o. daily p.r.n.     Julio Sicks, N.P.                        Freddy Finner, M.D.    CC/MEDQ  D:  04/03/2003  T:  04/03/2003  Job:  [id number]

## 2011-03-24 NOTE — H&P (Signed)
NAMEINFINITI, HOEFLING                  ACCOUNT NO.:  [id number]   MEDICAL RECORD NO.:  [id number]          PATIENT TYPE:  INP   LOCATION:  NA                            [facility]AC[facility]:  [facility]   PHYSICIAN:  Juluis Mire, M.D.   DATE OF BIRTH:  04/07/1971   DATE OF ADMISSION:  DATE OF DISCHARGE:                                HISTORY & PHYSICAL   The patient is a 40 year old gravida 2, para 1, abortus 0, female, last  menstrual period of October 10, giving her an estimated date of confinement  of July 17.  This does give her an estimated gestational age of  approximately 26 weeks.  This is consistent with initial exam and prior  early ultrasound.  She now presents for repeat cesarean section.   In relation to the present admission, the patient's last pregnancy was  complicated by breech presentation.  She did have a primary low transverse  cesarean section.  We had discussed with her a trial of labor, which she had  declined, wishing to proceed with repeat cesarean section, for which she is  admitted at the present time.   Her prenatal course was also complicated by advanced maternal age.  She did  undergo first trimester ultrasound at [facility] and blood work, all  of which was negative.  She had declined any type of genetic amniocentesis  at this point in time.  Her maternal serum alpha-fetoprotein was also within  normal limits and, again, second trimester ultrasounds were normal.  We had  discussed the limitations of this compared to amniocentesis but again  declined amniocentesis.  The patient's husband does have a history of  genital herpes.  She has had no known outbreaks.  The patient's husband is  also positive for hepatitis B.  The patient has been tested and remains  negative.  Finally, the patient does have a history of mitral valve  prolapse.  Echocardiogram shows minimal regurgitation.  The patient does not  use SBE prophylaxis.   In terms of allergies, no known drug  allergies.   MEDICATIONS:  Prenatal vitamins.   For past medical history, family history and social history, please see  prenatal records.   REVIEW OF SYSTEMS:  Noncontributory.   PHYSICAL EXAMINATION:  VITAL SIGNS:  The patient is afebrile with stable  vital signs.  HEENT:  Patient normocephalic, pupils equal, round, and reactive to light  and accommodation.  Extraocular movements were intact.  Sclerae and  conjunctivae clear.  Oropharynx clear.  NECK:  Without thyromegaly.  BREASTS:  Glandular but no discrete masses.  CHEST:  Lungs clear.  CARDIAC:  Regular rhythm and rate with a grade 2/6 systolic ejection murmur,  no clicks or gallops.  ABDOMEN:  Gravid uterus consistent with dates.  PELVIC:  Cervix is long and closed.  EXTREMITIES:  Trace edema.  NEUROLOGIC:  Grossly within normal limits.  Deep tendon reflexes 2+ and no  clonus.   IMPRESSION:  Intrauterine pregnancy at term with prior cesarean section,  desirous of repeat.   PLAN:  The patient will undergo repeat low  transverse cesarean section.  The  risks were discussed, including the risk of infection; the risk of  hemorrhage that could require transfusion or possible hysterectomy.  Transfusions carry with them the risk of AIDS or hepatitis.  There is a risk  of injury to adjacent organs, including bladder, bowel or ureters, that  could require further exploratory surgery.  Risk of deep venous thrombosis  and pulmonary embolus.  The patient expressed an understanding of the  indications and risks.       JSM/MEDQ  D:  05/11/2005  T:  05/11/2005  Job:  [id number]

## 2011-03-24 NOTE — Discharge Summary (Signed)
NAMEYESLIN, Alison Bradley                  ACCOUNT NO.:  [id number]   MEDICAL RECORD NO.:  [id number]          PATIENT TYPE:  INP   LOCATION:  9126                          FACILITY:  [facility]   PHYSICIAN:  Guy Sandifer. Henderson Cloud, M.D. DATE OF BIRTH:  [birth year]   DATE OF ADMISSION:  05/11/2005  DATE OF DISCHARGE:  05/13/2005                                 DISCHARGE SUMMARY   ADMISSION DIAGNOSES:  1.  Intrauterine pregnancy at 17 weeks' estimated gestational age/  2.  Previous cesarean section. desires repeat.   DISCHARGE DIAGNOSES:  1.  Status post low transverse cesarean section.  2.  Viable female infant.   PROCEDURE:  Repeat low transverse cesarean section.   REASON FOR ADMISSION:  Please see dictated H&P.   HOSPITAL COURSE:  The patient is 40 year old gravida 2, para 1, that was  admitted to [facility] for a scheduled cesarean section.  The patient had had a previous cesarean delivery due to a breech  presentation.  The patient due to previous cesarean delivery desired repeat.  On the morning of admission the patient was taken to the operating room,  where spinal anesthesia was administered without difficulty.  A low  transverse incision was made with the delivery of a viable female infant  weighing 7 pounds 4 ounces with Apgars of 9 at one minute and 9 at five  minutes.  Arterial cord pH was 7.33.  The patient tolerated the procedure  well and was taken to the recovery room in stable condition.  On postoperative day #1, vital signs were stable, she was afebrile.  Abdomen  was soft with good return of bowel function.  Fundus was firm and nontender.  Abdominal dressing was noted to be clean, dry and intact.  Laboratory  findings revealed hemoglobin of 10.9, platelet count 178,000, WBC count of  14.8.  The patient had a history of postpartum depression did desire to  start antidepressants.  On postoperative day #2, the patient was without  complaint.  The vital signs were stable,  abdomen was soft.  Fundus was firm  and nontender.  Incision was clean, dry and intact.  The patient was  ambulating well and tolerating a regular diet without complaints of nausea  and vomiting.  Postpartum instructions were reviewed and the patient was  later discharged home.   CONDITION ON DISCHARGE:  Stable.   DIET:  Regular as tolerated.   ACTIVITY:  No heavy lifting, no driving x2 weeks, no vaginal entry.   FOLLOW-UP:  Patient to follow up in the office in one to two weeks for an  incision check.  She is to call for temperature greater than 100 degrees,  persistent nausea or vomiting, heavy vaginal bleeding and/or redness or  drainage from the incisional site.   DISCHARGE MEDICATIONS:  1.  Zoloft 50 mg, #30 one p.o. q.a.m.  2.  Percocet 5/325 mg, #30, one p.o. every four to six hours p.r.n.  3.  Motrin 600 mg every six hours p.r.n. .  4.  Prenatal vitamins one p.o. daily,  5.  Colace one .p.o.  daily.      Julio Sicks, N.P.      Guy Sandifer. Henderson Cloud, M.D.  Electronically Signed    CC/MEDQ  D:  06/06/2005  T:  06/06/2005  Job:  [id number]

## 2011-03-24 NOTE — Op Note (Signed)
Alison Bradley, FREEHLING                  ACCOUNT NO.:  [id number]   MEDICAL RECORD NO.:  [id number]          PATIENT TYPE:  INP   LOCATION:  NA                            [facility]AC[facility]:  [facility]   PHYSICIAN:  Juluis Mire, M.D.   DATE OF BIRTH:  [birth year]   DATE OF PROCEDURE:  05/11/2005  DATE OF DISCHARGE:                                 OPERATIVE REPORT   PREOPERATIVE DIAGNOSES:  1.  Intrauterine pregnancy at term.  2.  Prior cesarean section, desires repeat.   POSTOPERATIVE DIAGNOSES:  1.  Intrauterine pregnancy at term.  2.  Prior cesarean section, desires repeat.   PROCEDURE:  Low transverse cesarean section.   SURGEON:  Juluis Mire, M.D.   ANESTHESIA:  Spinal.   ESTIMATED BLOOD LOSS:  400-500 cc.   PACKS AND DRAINS:  None.   INTRAOPERATIVE BLOOD REPLACED:  None.   COMPLICATIONS:  None.   INDICATIONS FOR PROCEDURE:  As noted in the history and physical.   DESCRIPTION OF PROCEDURE:  The patient was taken to the OR and placed in the  supine position with a left lateral tilt.  After a satisfactory level of  spinal anesthesia was obtained, the abdomen was prepped out with Betadine  and draped as a sterile field.   The prior low transverse skin incision was then excised.  The incision was  extended through the subcutaneous tissue.  The fascia was entered sharply,  and the incision in the fascia was extended laterally.  The fascia was taken  off of the muscle superiorly and inferiorly.  The rectus muscles were  separated in the midline.  The anterior peritoneum was entered sharply, and  the incision in the peritoneum was extended both superiorly and inferiorly.  A low transverse bladder flap was developed.  A low transverse uterine was  begun with a knife and extended laterally using manual retraction.  The  amniotic fluid was clear.  The infant presented in the vertex presentation  and was delivered with fundal pressure and the vacuum extractor.  The infant  was a viable female.   Apgars were 9/9.  Umbilical artery pH was 7.33.  Weight  is pending.  The placenta was then delivered manually.  The uterus was then  closed with running interlocking suture of 0 chromic using a 2-0 closure  technique.  We had good hemostasis.  Urine output remained clear.  The tubes  and ovaries were unremarkable.  The muscles were reapproximated with running  suture of 3-0 Vicryl.  The fascia was closed with running suture of 0 PDS.  The skin was closed with staples and Steri-Strips.  Sponge, needle, and instrument counts were reported as correct by the  circulating nurse x2.  The Foley catheter remained clear at the time of  closure.   The patient tolerated the procedure well and was returned to the recovery  room in good condition.       JSM/MEDQ  D:  05/11/2005  T:  05/11/2005  Job:  [id number]

## 2011-05-31 ENCOUNTER — Other Ambulatory Visit: Payer: Self-pay | Admitting: Obstetrics and Gynecology

## 2011-08-07 ENCOUNTER — Other Ambulatory Visit: Payer: Self-pay | Admitting: Obstetrics and Gynecology

## 2011-08-07 DIAGNOSIS — Z1231 Encounter for screening mammogram for malignant neoplasm of breast: Secondary | ICD-10-CM

## 2011-10-11 ENCOUNTER — Encounter: Payer: Self-pay | Admitting: Family Medicine

## 2011-10-11 ENCOUNTER — Ambulatory Visit (INDEPENDENT_AMBULATORY_CARE_PROVIDER_SITE_OTHER): Payer: BC Managed Care – PPO | Admitting: Family Medicine

## 2011-10-11 DIAGNOSIS — M542 Cervicalgia: Secondary | ICD-10-CM

## 2011-10-11 DIAGNOSIS — E785 Hyperlipidemia, unspecified: Secondary | ICD-10-CM

## 2011-10-11 DIAGNOSIS — H9209 Otalgia, unspecified ear: Secondary | ICD-10-CM

## 2011-10-11 DIAGNOSIS — Z0000 Encounter for general adult medical examination without abnormal findings: Secondary | ICD-10-CM

## 2011-10-11 DIAGNOSIS — E28319 Asymptomatic premature menopause: Secondary | ICD-10-CM | POA: Insufficient documentation

## 2011-10-11 HISTORY — DX: Hyperlipidemia, unspecified: E78.5

## 2011-10-11 HISTORY — DX: Asymptomatic premature menopause: E28.319

## 2011-10-11 MED ORDER — CYCLOBENZAPRINE HCL 10 MG PO TABS: 10.0000 mg | ORAL_TABLET | Freq: Two times a day (BID) | ORAL | Status: DC | PRN

## 2011-10-11 NOTE — Patient Instructions (Signed)

## 2011-10-13 DIAGNOSIS — Z0000 Encounter for general adult medical examination without abnormal findings: Secondary | ICD-10-CM | POA: Insufficient documentation

## 2011-10-13 NOTE — Assessment & Plan Note (Signed)
Influenza shot is UTD, sees [facility] for gyn care, encouraged heart healthy diet and regular exercise

## 2011-10-13 NOTE — Assessment & Plan Note (Signed)
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 Patient is given some Cyclobenzaprine to use bid for muscle spasm and pain. Encouraged moist heat and gentle stretching and canc continue with chiropractic and massage therapy.

## 2011-10-13 NOTE — Assessment & Plan Note (Signed)
Patient reports mild history of. Encouraged avoid trans fats and check a lipid panel
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
## 2011-10-13 NOTE — Assessment & Plan Note (Signed)
Patient maintained on Estrogen and progesterone by OB/GYN

## 2011-10-13 NOTE — Assessment & Plan Note (Signed)
Patient reports sometimes intolerable pain s/p her T and A in March of 2012, she has seen 2 ENTs and her new one is proceeding with an MRI to further evaluate and he started her on Gabapentin secondary to hypothesis of nerve damage or nerve related pain. She is symptomatically improved but she believes it correlates more with chiropractic adjustment of her neck than the med.

## 2011-10-25 ENCOUNTER — Ambulatory Visit (INDEPENDENT_AMBULATORY_CARE_PROVIDER_SITE_OTHER): Payer: BC Managed Care – PPO | Admitting: Family Medicine

## 2011-10-25 ENCOUNTER — Encounter: Payer: Self-pay | Admitting: Family Medicine

## 2011-10-25 VITALS — BP 109/75 | HR 80 | Temp 97.7°F | Ht 64.5 in | Wt 143.0 lb

## 2011-10-25 DIAGNOSIS — J209 Acute bronchitis, unspecified: Secondary | ICD-10-CM

## 2011-10-25 DIAGNOSIS — R070 Pain in throat: Secondary | ICD-10-CM

## 2011-10-25 DIAGNOSIS — H9209 Otalgia, unspecified ear: Secondary | ICD-10-CM

## 2011-10-25 MED ORDER — HYDROCODONE-HOMATROPINE 5-1.5 MG/5ML PO SYRP: ORAL_SOLUTION | ORAL | Status: DC

## 2011-10-25 MED ORDER — FEXOFENADINE HCL 180 MG PO TABS: 180.0000 mg | ORAL_TABLET | Freq: Every day | ORAL | Status: DC

## 2011-10-25 MED ORDER — PREDNISONE 20 MG PO TABS: ORAL_TABLET | ORAL | Status: DC

## 2011-10-25 MED ORDER — HYDROCOD POLST-CHLORPHEN POLST 10-8 MG/5ML PO LQCR: ORAL | Status: DC

## 2011-10-25 MED ORDER — DEXLANSOPRAZOLE 60 MG PO CPDR: 60.0000 mg | DELAYED_RELEASE_CAPSULE | Freq: Every day | ORAL | Status: DC

## 2011-10-25 MED ORDER — ALBUTEROL SULFATE HFA 108 (90 BASE) MCG/ACT IN AERS
2.0000 | INHALATION_SPRAY | Freq: Four times a day (QID) | RESPIRATORY_TRACT | Status: DC | PRN
Start: 2011-10-25 — End: 2011-12-05
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
## 2011-11-02 ENCOUNTER — Ambulatory Visit (INDEPENDENT_AMBULATORY_CARE_PROVIDER_SITE_OTHER): Payer: BC Managed Care – PPO | Admitting: Family Medicine

## 2011-11-02 ENCOUNTER — Encounter: Payer: Self-pay | Admitting: Family Medicine

## 2011-11-02 VITALS — BP 103/71 | HR 80 | Temp 98.0°F | Wt 145.0 lb

## 2011-11-02 DIAGNOSIS — R05 Cough: Secondary | ICD-10-CM

## 2011-11-02 MED ORDER — HYDROCODONE-HOMATROPINE 5-1.5 MG/5ML PO SYRP: ORAL_SOLUTION | ORAL | Status: AC

## 2011-11-02 MED ORDER — PREDNISONE 20 MG PO TABS
ORAL_TABLET | ORAL | Status: DC
Start: 2011-11-02 — End: 2011-12-05
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
## 2011-12-05 ENCOUNTER — Ambulatory Visit (INDEPENDENT_AMBULATORY_CARE_PROVIDER_SITE_OTHER): Payer: BC Managed Care – PPO | Admitting: Family Medicine

## 2011-12-05 ENCOUNTER — Encounter: Payer: Self-pay | Admitting: Family Medicine

## 2011-12-05 VITALS — BP 124/86 | HR 85 | Temp 97.9°F | Ht 64.5 in | Wt 149.8 lb

## 2011-12-05 DIAGNOSIS — M542 Cervicalgia: Secondary | ICD-10-CM

## 2011-12-05 DIAGNOSIS — J329 Chronic sinusitis, unspecified: Secondary | ICD-10-CM

## 2011-12-05 DIAGNOSIS — H9209 Otalgia, unspecified ear: Secondary | ICD-10-CM

## 2011-12-05 MED ORDER — MUPIROCIN 2 % EX OINT: TOPICAL_OINTMENT | Freq: Every day | CUTANEOUS | Status: AC

## 2011-12-05 MED ORDER — SULFAMETHOXAZOLE-TRIMETHOPRIM 800-160 MG PO TABS: 1.0000 | ORAL_TABLET | Freq: Two times a day (BID) | ORAL | Status: AC

## 2011-12-05 MED ORDER — CYCLOBENZAPRINE HCL 10 MG PO TABS: 10.0000 mg | ORAL_TABLET | Freq: Two times a day (BID) | ORAL | Status: DC | PRN
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
## 2011-12-05 NOTE — Patient Instructions (Addendum)
Sinusitis Sinuses are air pockets within the bones of your face. The growth of bacteria within a sinus leads to infection. The infection prevents the sinuses from draining. This infection is called sinusitis. SYMPTOMS  There will be different areas of pain depending on which sinuses have become infected.  The maxillary sinuses often produce pain beneath the eyes.   Frontal sinusitis may cause pain in the middle of the forehead and above the eyes.  Other problems (symptoms) include:  Toothaches.   Colored, pus-like (purulent) drainage from the nose.   Swelling, warmth, and tenderness over the sinus areas may be signs of infection.  TREATMENT  Sinusitis is most often determined by an exam.X-rays may be taken. If x-rays have been taken, make sure you obtain your results or find out how you are to obtain them. Your caregiver may give you medications (antibiotics). These are medications that will help kill the bacteria causing the infection. You may also be given a medication (decongestant) that helps to reduce sinus swelling.  HOME CARE INSTRUCTIONS   Only take over-the-counter or prescription medicines for pain, discomfort, or fever as directed by your caregiver.   Drink extra fluids. Fluids help thin the mucus so your sinuses can drain more easily.   Applying either moist heat or ice packs to the sinus areas may help relieve discomfort.   Use saline nasal sprays to help moisten your sinuses. The sprays can be found at your local drugstore.  SEEK IMMEDIATE MEDICAL CARE IF:  You have a fever.   You have increasing pain, severe headaches, or toothache.   You have nausea, vomiting, or drowsiness.   You develop unusual swelling around the face or trouble seeing.  MAKE SURE YOU:   Understand these instructions.   Will watch your condition.   Will get help right away if you are not doing well or get worse.  Document Released: 10/23/2005 Document Revised: 07/05/2011 Document Reviewed:  05/22/2007 [facility] Patient Information 2012 [place], [place].  Mucinex twice daily x 10 days

## 2011-12-10 ENCOUNTER — Encounter: Payer: Self-pay | Admitting: Family Medicine

## 2011-12-10 DIAGNOSIS — J329 Chronic sinusitis, unspecified: Secondary | ICD-10-CM | POA: Insufficient documentation

## 2011-12-10 NOTE — Assessment & Plan Note (Signed)
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

## 2011-12-10 NOTE — Assessment & Plan Note (Signed)
Referred pain from sinuses and some persistent SOM, encouraged antihistamine and nasal saline daily

## 2011-12-19 ENCOUNTER — Ambulatory Visit
Admission: RE | Admit: 2011-12-19 | Discharge: 2011-12-19 | Disposition: A | Discharge status: Home / Self Care | Payer: BC Managed Care – PPO | Source: Ambulatory Visit | Attending: Obstetrics and Gynecology | Admitting: Obstetrics and Gynecology

## 2011-12-19 DIAGNOSIS — Z1231 Encounter for screening mammogram for malignant neoplasm of breast: Secondary | ICD-10-CM

## 2012-01-31 ENCOUNTER — Ambulatory Visit (INDEPENDENT_AMBULATORY_CARE_PROVIDER_SITE_OTHER): Payer: BC Managed Care – PPO | Admitting: Family Medicine

## 2012-01-31 ENCOUNTER — Encounter: Payer: Self-pay | Admitting: Family Medicine

## 2012-01-31 VITALS — BP 124/83 | HR 109 | Temp 98.2°F | Ht 64.5 in | Wt 158.8 lb

## 2012-01-31 DIAGNOSIS — J40 Bronchitis, not specified as acute or chronic: Secondary | ICD-10-CM

## 2012-01-31 DIAGNOSIS — R000 Tachycardia, unspecified: Secondary | ICD-10-CM

## 2012-01-31 DIAGNOSIS — E860 Dehydration: Secondary | ICD-10-CM

## 2012-01-31 DIAGNOSIS — H9209 Otalgia, unspecified ear: Secondary | ICD-10-CM

## 2012-01-31 DIAGNOSIS — J329 Chronic sinusitis, unspecified: Secondary | ICD-10-CM

## 2012-01-31 DIAGNOSIS — M542 Cervicalgia: Secondary | ICD-10-CM

## 2012-01-31 HISTORY — DX: Tachycardia, unspecified: R00.0

## 2012-01-31 HISTORY — DX: Dehydration: E86.0

## 2012-01-31 MED ORDER — HYDROCOD POLST-CHLORPHEN POLST 10-8 MG/5ML PO LQCR: 5.0000 mL | Freq: Every evening | ORAL | Status: DC | PRN

## 2012-01-31 MED ORDER — METHYLPREDNISOLONE 4 MG PO KIT: PACK | ORAL | Status: AC

## 2012-01-31 MED ORDER — CIPROFLOXACIN HCL 500 MG PO TABS: 500.0000 mg | ORAL_TABLET | Freq: Two times a day (BID) | ORAL | Status: AC

## 2012-01-31 MED ORDER — CYCLOBENZAPRINE HCL 10 MG PO TABS: 10.0000 mg | ORAL_TABLET | Freq: Two times a day (BID) | ORAL | Status: DC | PRN

## 2012-01-31 MED ORDER — GUAIFENESIN ER 600 MG PO TB12: 600.0000 mg | ORAL_TABLET | Freq: Two times a day (BID) | ORAL | Status: AC

## 2012-01-31 NOTE — Patient Instructions (Addendum)
Dehydration, Adult Dehydration is when you lose more fluids from the body than you take in. Vital organs like the kidneys, brain, and heart cannot function without a proper amount of fluids and salt. Any loss of fluids from the body can cause dehydration.  CAUSES   Vomiting.   Diarrhea.   Excessive sweating.   Excessive urine output.   Fever.  SYMPTOMS  Mild dehydration  Thirst.   Dry lips.   Slightly dry mouth.  Moderate dehydration  Very dry mouth.   Sunken eyes.   Skin does not bounce back quickly when lightly pinched and released.   Dark urine and decreased urine production.   Decreased tear production.   Headache.  Severe dehydration  Very dry mouth.   Extreme thirst.   Rapid, weak pulse (more than 100 beats per minute at rest).   Cold hands and feet.   Not able to sweat in spite of heat and temperature.   Rapid breathing.   Blue lips.   Confusion and lethargy.   Difficulty being awakened.   Minimal urine production.   No tears.  DIAGNOSIS  Your caregiver will diagnose dehydration based on your symptoms and your exam. Blood and urine tests will help confirm the diagnosis. The diagnostic evaluation should also identify the cause of dehydration. TREATMENT  Treatment of mild or moderate dehydration can often be done at home by increasing the amount of fluids that you drink. It is best to drink small amounts of fluid more often. Drinking too much at one time can make vomiting worse. Refer to the home care instructions below. Severe dehydration needs to be treated at the hospital where you will probably be given intravenous (IV) fluids that contain water and electrolytes. HOME CARE INSTRUCTIONS   Ask your caregiver about specific rehydration instructions.   Drink enough fluids to keep your urine clear or pale yellow.   Drink small amounts frequently if you have nausea and vomiting.   Eat as you normally do.   Avoid:   Foods or drinks high in  sugar.   Carbonated drinks.   Juice.   Extremely hot or cold fluids.   Drinks with caffeine.   Fatty, greasy foods.   Alcohol.   Tobacco.   Overeating.   Gelatin desserts.   Wash your hands well to avoid spreading bacteria and viruses.   Only take over-the-counter or prescription medicines for pain, discomfort, or fever as directed by your caregiver.   Ask your caregiver if you should continue all prescribed and over-the-counter medicines.   Keep all follow-up appointments with your caregiver.  SEEK MEDICAL CARE IF:  You have abdominal pain and it increases or stays in one area (localizes).   You have a rash, stiff neck, or severe headache.   You are irritable, sleepy, or difficult to awaken.   You are weak, dizzy, or extremely thirsty.  SEEK IMMEDIATE MEDICAL CARE IF:   You are unable to keep fluids down or you get worse despite treatment.   You have frequent episodes of vomiting or diarrhea.   You have blood or green matter (bile) in your vomit.   You have blood in your stool or your stool looks black and tarry.   You have not urinated in 6 to 8 hours, or you have only urinated a small amount of very dark urine.   You have a fever.   You faint.  MAKE SURE YOU:   Understand these instructions.   Will watch your condition.     Will get help right away if you are not doing well or get worse.  Document Released: 10/23/2005 Document Revised: 10/12/2011 Document Reviewed: 06/12/2011 [facility] Patient Information 2012 [place], [place].  Bronchitis Bronchitis is the body's way of reacting to injury and/or infection (inflammation) of the bronchi. Bronchi are the air tubes that extend from the windpipe into the lungs. If the inflammation becomes severe, it may cause shortness of breath. CAUSES  Inflammation may be caused by:  A virus.   Germs (bacteria).   Dust.   Allergens.   Pollutants and many other irritants.  The cells lining the bronchial tree are  covered with tiny hairs (cilia). These constantly beat upward, away from the lungs, toward the mouth. This keeps the lungs free of pollutants. When these cells become too irritated and are unable to do their job, mucus begins to develop. This causes the characteristic cough of bronchitis. The cough clears the lungs when the cilia are unable to do their job. Without either of these protective mechanisms, the mucus would settle in the lungs. Then you would develop pneumonia. Smoking is a common cause of bronchitis and can contribute to pneumonia. Stopping this habit is the single most important thing you can do to help yourself. TREATMENT   Your caregiver may prescribe an antibiotic if the cough is caused by bacteria. Also, medicines that open up your airways make it easier to breathe. Your caregiver may also recommend or prescribe an expectorant. It will loosen the mucus to be coughed up. Only take over-the-counter or prescription medicines for pain, discomfort, or fever as directed by your caregiver.   Removing whatever causes the problem (smoking, for example) is critical to preventing the problem from getting worse.   Cough suppressants may be prescribed for relief of cough symptoms.   Inhaled medicines may be prescribed to help with symptoms now and to help prevent problems from returning.   For those with recurrent (chronic) bronchitis, there may be a need for steroid medicines.  SEEK IMMEDIATE MEDICAL CARE IF:   During treatment, you develop more pus-like mucus (purulent sputum).   You have a fever.   Your baby is older than 3 months with a rectal temperature of 102 F (38.9 C) or higher.   Your baby is 35 months old or younger with a rectal temperature of 100.4 F (38 C) or higher.   You become progressively more ill.   You have increased difficulty breathing, wheezing, or shortness of breath.  It is necessary to seek immediate medical care if you are elderly or sick from any other  disease. MAKE SURE YOU:   Understand these instructions.   Will watch your condition.   Will get help right away if you are not doing well or get worse.  Document Released: 10/23/2005 Document Revised: 10/12/2011 Document Reviewed: 09/01/2008 [facility] Patient Information 2012 [place], [place].  Continue to eat yogurt daily and start a probiotic daily such as Librarian, academic daily [employer], Culturelle)

## 2012-01-31 NOTE — Assessment & Plan Note (Signed)
Not flared presently

## 2012-01-31 NOTE — Assessment & Plan Note (Signed)
Patient with several days of increasing cough and difficulty sleeping. Started on Ciprofloxacin, Mucinex, Medrol and Tussionex report if no improvement.

## 2012-01-31 NOTE — Assessment & Plan Note (Signed)
Pain slightly increased today due to her acute illness but had been doing better s/p a set of 10 injections done by her neurologist about a month ago in her occiput and scalp behind the ear.  Will request records from Dr Tonette Lederer (sp?) to evaluate

## 2012-01-31 NOTE — Assessment & Plan Note (Signed)
Likely worsened by dehydration, push clear fluids and she reports her OB/GYN did labs recently when she had her papa done and she is certain he did a TSH and CBC so we will request his records
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
## 2012-01-31 NOTE — Assessment & Plan Note (Signed)
Patient is very dry on exam encouraged increased hydration whenever she gets a cough

## 2012-10-22 ENCOUNTER — Ambulatory Visit: Payer: BC Managed Care – PPO | Admitting: Family Medicine

## 2012-11-29 ENCOUNTER — Other Ambulatory Visit: Payer: Self-pay | Admitting: Obstetrics and Gynecology

## 2012-11-29 DIAGNOSIS — Z139 Encounter for screening, unspecified: Secondary | ICD-10-CM

## 2012-12-19 ENCOUNTER — Ambulatory Visit: Payer: BC Managed Care – PPO

## 2012-12-31 ENCOUNTER — Ambulatory Visit (INDEPENDENT_AMBULATORY_CARE_PROVIDER_SITE_OTHER): Payer: BC Managed Care – PPO

## 2012-12-31 DIAGNOSIS — Z1231 Encounter for screening mammogram for malignant neoplasm of breast: Secondary | ICD-10-CM

## 2012-12-31 DIAGNOSIS — Z139 Encounter for screening, unspecified: Secondary | ICD-10-CM

## 2013-07-24 ENCOUNTER — Encounter: Payer: Self-pay | Admitting: Family Medicine

## 2013-07-24 ENCOUNTER — Ambulatory Visit (INDEPENDENT_AMBULATORY_CARE_PROVIDER_SITE_OTHER): Payer: BC Managed Care – PPO | Admitting: Family Medicine

## 2013-07-24 VITALS — BP 100/60 | HR 83 | Temp 98.4°F | Ht 64.5 in | Wt 152.1 lb

## 2013-07-24 DIAGNOSIS — Z23 Encounter for immunization: Secondary | ICD-10-CM

## 2013-07-24 DIAGNOSIS — H9201 Otalgia, right ear: Secondary | ICD-10-CM

## 2013-07-24 DIAGNOSIS — H9209 Otalgia, unspecified ear: Secondary | ICD-10-CM

## 2013-07-24 DIAGNOSIS — E785 Hyperlipidemia, unspecified: Secondary | ICD-10-CM

## 2013-07-24 DIAGNOSIS — F319 Bipolar disorder, unspecified: Secondary | ICD-10-CM

## 2013-07-24 NOTE — Patient Instructions (Signed)
Try ice and Aspercreme/Salon Pas   Temporomandibular Joint Pain Your exam shows that you have a problem with your temporomandibular joint (TMJ), the joint that moves when you open your mouth or chew food. TMJ problems can result from direct injuries, bite abnormalities, or tension states which cause you to grind or clench your teeth. Typical symptoms include pain around the joint, clicking, restricted movement, and headaches. The TMJ is like any other joint in the body; when it is strained, it needs rest to repair itself. To keep the joint at rest it is important that you do not open your mouth wider than the width of your index finger. If you must yawn, be sure to support your chin with your hand so your mouth does not open wide. Eat a soft diet (nothing firmer than ground beef, no raw vegetables), do not chew gum and do not talk if it causes you pain. Apply topical heat by using a warm, moist cloth placed in front of the ear for 15 to 20 minutes several times daily. Alternating heat and ice may give even more relief. Anti-inflammatory pain medicine and muscle relaxants can also be helpful. A dental orthotic or splint may be used for temporary relief. Long-term problems may require treatment for stress as well as braces or surgery. Please check with your doctor or dentist if your symptoms do not improve within one week. Document Released: 11/30/2004 Document Revised: 01/15/2012 Document Reviewed: 10/23/2005 [facility] Patient Information 2014 [place], [place].
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
## 2013-07-26 ENCOUNTER — Encounter: Payer: Self-pay | Admitting: Family Medicine

## 2013-07-26 DIAGNOSIS — F319 Bipolar disorder, unspecified: Secondary | ICD-10-CM

## 2013-07-26 HISTORY — DX: Bipolar disorder, unspecified: F31.9

## 2013-07-26 NOTE — Assessment & Plan Note (Signed)
No sign of infection, is going to return to her neurologist for repeat Botox injection. May try massaging the temples with Salon Pas cream prn

## 2013-07-26 NOTE — Assessment & Plan Note (Signed)
Receives meds from Dr Nolen Mu, using Alprazolam 1-3 mg daily

## 2013-07-26 NOTE — Assessment & Plan Note (Signed)
Agrees to return for fasting labs prior to her next visit.

## 2013-08-01 ENCOUNTER — Telehealth: Payer: Self-pay

## 2013-08-01 NOTE — Telephone Encounter (Signed)
Pt came into the office today to pickup letter MD wrote. Pt states she doesn't take alprazolam, Ambien or Tramadol.  Pt stated 1. is primary care doctor 2. List of current meds not prescribed by dr blyth [place] patch, toltendone, progesterone, lamictol, zyprexa 3. 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 Marj informed patient that md is out of the office until Monday.

## 2013-08-01 NOTE — Telephone Encounter (Signed)
Please change the letter and I will sign next week. OK to add sentence stating I am patient's primary care doctor. Also can change med list to take out the Alprazolam, xanax and Tramadol and add meds she listed she gets elsewhere. 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 Thanks

## 2013-08-01 NOTE — Telephone Encounter (Signed)
Letter done and pt informed she can come by Monday afternoon to pick up. My printer however is not working. Will reprint on Monday.

## 2013-08-04 NOTE — Telephone Encounter (Signed)
Patient called back regarding this. Also, she states that letter needs to be written on company letter head

## 2013-08-04 NOTE — Telephone Encounter (Signed)
Detailed message left on vm. Letter at front desk

## 2013-12-03 ENCOUNTER — Other Ambulatory Visit: Payer: Self-pay | Admitting: Obstetrics and Gynecology

## 2013-12-03 DIAGNOSIS — Z1231 Encounter for screening mammogram for malignant neoplasm of breast: Secondary | ICD-10-CM

## 2014-01-06 ENCOUNTER — Ambulatory Visit (INDEPENDENT_AMBULATORY_CARE_PROVIDER_SITE_OTHER): Payer: BC Managed Care – PPO

## 2014-01-06 DIAGNOSIS — Z1231 Encounter for screening mammogram for malignant neoplasm of breast: Secondary | ICD-10-CM

## 2014-02-10 ENCOUNTER — Other Ambulatory Visit: Payer: Self-pay | Admitting: Obstetrics and Gynecology

## 2014-02-10 DIAGNOSIS — R922 Inconclusive mammogram: Secondary | ICD-10-CM

## 2014-02-10 DIAGNOSIS — Z803 Family history of malignant neoplasm of breast: Secondary | ICD-10-CM

## 2014-03-02 ENCOUNTER — Ambulatory Visit
Admission: RE | Admit: 2014-03-02 | Discharge: 2014-03-02 | Disposition: A | Discharge status: Home / Self Care | Payer: BC Managed Care – PPO | Source: Ambulatory Visit | Attending: Obstetrics and Gynecology | Admitting: Obstetrics and Gynecology

## 2014-03-02 DIAGNOSIS — Z803 Family history of malignant neoplasm of breast: Secondary | ICD-10-CM

## 2014-03-02 DIAGNOSIS — R922 Inconclusive mammogram: Secondary | ICD-10-CM

## 2014-03-02 MED ORDER — GADOBENATE DIMEGLUMINE 529 MG/ML IV SOLN
14.0000 mL | Freq: Once | INTRAVENOUS | Status: AC | PRN
  Administered 2014-03-02: 14 mL via INTRAVENOUS

## 2014-11-17 ENCOUNTER — Other Ambulatory Visit: Payer: Self-pay | Admitting: Obstetrics and Gynecology

## 2014-11-17 DIAGNOSIS — Z1231 Encounter for screening mammogram for malignant neoplasm of breast: Secondary | ICD-10-CM

## 2015-01-13 ENCOUNTER — Ambulatory Visit (INDEPENDENT_AMBULATORY_CARE_PROVIDER_SITE_OTHER): Payer: BLUE CROSS/BLUE SHIELD

## 2015-01-13 DIAGNOSIS — Z1231 Encounter for screening mammogram for malignant neoplasm of breast: Secondary | ICD-10-CM

## 2015-02-08 ENCOUNTER — Other Ambulatory Visit: Payer: Self-pay | Admitting: Obstetrics and Gynecology

## 2015-02-09 ENCOUNTER — Other Ambulatory Visit: Payer: Self-pay | Admitting: Obstetrics and Gynecology

## 2015-02-09 DIAGNOSIS — Z803 Family history of malignant neoplasm of breast: Secondary | ICD-10-CM

## 2015-02-09 LAB — CYTOLOGY - PAP

## 2015-02-23 ENCOUNTER — Ambulatory Visit
Admission: RE | Admit: 2015-02-23 | Discharge: 2015-02-23 | Disposition: A | Discharge status: Home / Self Care | Payer: BLUE CROSS/BLUE SHIELD | Source: Ambulatory Visit | Attending: Obstetrics and Gynecology | Admitting: Obstetrics and Gynecology

## 2015-02-23 DIAGNOSIS — Z803 Family history of malignant neoplasm of breast: Secondary | ICD-10-CM

## 2015-02-23 MED ORDER — GADOBENATE DIMEGLUMINE 529 MG/ML IV SOLN
14.0000 mL | Freq: Once | INTRAVENOUS | Status: AC | PRN
Start: 2015-02-23 — End: 2015-02-23
  Administered 2015-02-23: 14 mL via INTRAVENOUS

## 2016-02-07 DIAGNOSIS — M9902 Segmental and somatic dysfunction of thoracic region: Secondary | ICD-10-CM | POA: Diagnosis not present

## 2016-02-07 DIAGNOSIS — M531 Cervicobrachial syndrome: Secondary | ICD-10-CM | POA: Diagnosis not present

## 2016-02-07 DIAGNOSIS — M9903 Segmental and somatic dysfunction of lumbar region: Secondary | ICD-10-CM | POA: Diagnosis not present

## 2016-02-07 DIAGNOSIS — M9901 Segmental and somatic dysfunction of cervical region: Secondary | ICD-10-CM | POA: Diagnosis not present

## 2016-02-09 DIAGNOSIS — Z1329 Encounter for screening for other suspected endocrine disorder: Secondary | ICD-10-CM | POA: Diagnosis not present

## 2016-02-09 DIAGNOSIS — Z01419 Encounter for gynecological examination (general) (routine) without abnormal findings: Secondary | ICD-10-CM | POA: Diagnosis not present

## 2016-02-09 DIAGNOSIS — Z1231 Encounter for screening mammogram for malignant neoplasm of breast: Secondary | ICD-10-CM | POA: Diagnosis not present

## 2016-02-09 DIAGNOSIS — Z6829 Body mass index (BMI) 29.0-29.9, adult: Secondary | ICD-10-CM | POA: Diagnosis not present

## 2016-02-09 DIAGNOSIS — Z13220 Encounter for screening for lipoid disorders: Secondary | ICD-10-CM | POA: Diagnosis not present

## 2016-02-09 DIAGNOSIS — Z1321 Encounter for screening for nutritional disorder: Secondary | ICD-10-CM | POA: Diagnosis not present

## 2016-02-09 DIAGNOSIS — Z13228 Encounter for screening for other metabolic disorders: Secondary | ICD-10-CM | POA: Diagnosis not present

## 2016-02-17 ENCOUNTER — Other Ambulatory Visit: Payer: Self-pay | Admitting: Obstetrics and Gynecology

## 2016-02-17 DIAGNOSIS — Z9189 Other specified personal risk factors, not elsewhere classified: Secondary | ICD-10-CM

## 2016-03-15 DIAGNOSIS — F3112 Bipolar disorder, current episode manic without psychotic features, moderate: Secondary | ICD-10-CM | POA: Diagnosis not present

## 2016-04-24 DIAGNOSIS — M531 Cervicobrachial syndrome: Secondary | ICD-10-CM | POA: Diagnosis not present

## 2016-04-24 DIAGNOSIS — M9902 Segmental and somatic dysfunction of thoracic region: Secondary | ICD-10-CM | POA: Diagnosis not present

## 2016-04-24 DIAGNOSIS — M9901 Segmental and somatic dysfunction of cervical region: Secondary | ICD-10-CM | POA: Diagnosis not present

## 2016-04-24 DIAGNOSIS — M9903 Segmental and somatic dysfunction of lumbar region: Secondary | ICD-10-CM | POA: Diagnosis not present

## 2016-05-22 DIAGNOSIS — M9902 Segmental and somatic dysfunction of thoracic region: Secondary | ICD-10-CM | POA: Diagnosis not present

## 2016-05-22 DIAGNOSIS — M9903 Segmental and somatic dysfunction of lumbar region: Secondary | ICD-10-CM | POA: Diagnosis not present

## 2016-05-22 DIAGNOSIS — M531 Cervicobrachial syndrome: Secondary | ICD-10-CM | POA: Diagnosis not present

## 2016-05-22 DIAGNOSIS — M9901 Segmental and somatic dysfunction of cervical region: Secondary | ICD-10-CM | POA: Diagnosis not present

## 2016-05-23 DIAGNOSIS — M531 Cervicobrachial syndrome: Secondary | ICD-10-CM | POA: Diagnosis not present

## 2016-05-23 DIAGNOSIS — M9903 Segmental and somatic dysfunction of lumbar region: Secondary | ICD-10-CM | POA: Diagnosis not present

## 2016-05-23 DIAGNOSIS — M9901 Segmental and somatic dysfunction of cervical region: Secondary | ICD-10-CM | POA: Diagnosis not present

## 2016-05-23 DIAGNOSIS — M9902 Segmental and somatic dysfunction of thoracic region: Secondary | ICD-10-CM | POA: Diagnosis not present

## 2016-05-25 DIAGNOSIS — M9902 Segmental and somatic dysfunction of thoracic region: Secondary | ICD-10-CM | POA: Diagnosis not present

## 2016-05-25 DIAGNOSIS — M531 Cervicobrachial syndrome: Secondary | ICD-10-CM | POA: Diagnosis not present

## 2016-05-25 DIAGNOSIS — M9901 Segmental and somatic dysfunction of cervical region: Secondary | ICD-10-CM | POA: Diagnosis not present

## 2016-05-25 DIAGNOSIS — M9903 Segmental and somatic dysfunction of lumbar region: Secondary | ICD-10-CM | POA: Diagnosis not present

## 2016-05-26 DIAGNOSIS — M9901 Segmental and somatic dysfunction of cervical region: Secondary | ICD-10-CM | POA: Diagnosis not present

## 2016-05-26 DIAGNOSIS — M9903 Segmental and somatic dysfunction of lumbar region: Secondary | ICD-10-CM | POA: Diagnosis not present

## 2016-05-26 DIAGNOSIS — M9902 Segmental and somatic dysfunction of thoracic region: Secondary | ICD-10-CM | POA: Diagnosis not present

## 2016-05-26 DIAGNOSIS — M531 Cervicobrachial syndrome: Secondary | ICD-10-CM | POA: Diagnosis not present

## 2016-06-01 DIAGNOSIS — M9901 Segmental and somatic dysfunction of cervical region: Secondary | ICD-10-CM | POA: Diagnosis not present

## 2016-06-01 DIAGNOSIS — M531 Cervicobrachial syndrome: Secondary | ICD-10-CM | POA: Diagnosis not present

## 2016-06-01 DIAGNOSIS — M9903 Segmental and somatic dysfunction of lumbar region: Secondary | ICD-10-CM | POA: Diagnosis not present

## 2016-06-01 DIAGNOSIS — M9902 Segmental and somatic dysfunction of thoracic region: Secondary | ICD-10-CM | POA: Diagnosis not present

## 2016-06-02 DIAGNOSIS — M9902 Segmental and somatic dysfunction of thoracic region: Secondary | ICD-10-CM | POA: Diagnosis not present

## 2016-06-02 DIAGNOSIS — M531 Cervicobrachial syndrome: Secondary | ICD-10-CM | POA: Diagnosis not present

## 2016-06-02 DIAGNOSIS — M9901 Segmental and somatic dysfunction of cervical region: Secondary | ICD-10-CM | POA: Diagnosis not present

## 2016-06-02 DIAGNOSIS — M9903 Segmental and somatic dysfunction of lumbar region: Secondary | ICD-10-CM | POA: Diagnosis not present

## 2016-08-28 DIAGNOSIS — F3112 Bipolar disorder, current episode manic without psychotic features, moderate: Secondary | ICD-10-CM | POA: Diagnosis not present

## 2016-11-27 DIAGNOSIS — F3112 Bipolar disorder, current episode manic without psychotic features, moderate: Secondary | ICD-10-CM | POA: Diagnosis not present

## 2016-11-29 DIAGNOSIS — M9901 Segmental and somatic dysfunction of cervical region: Secondary | ICD-10-CM | POA: Diagnosis not present

## 2016-11-29 DIAGNOSIS — M9902 Segmental and somatic dysfunction of thoracic region: Secondary | ICD-10-CM | POA: Diagnosis not present

## 2016-11-29 DIAGNOSIS — M9903 Segmental and somatic dysfunction of lumbar region: Secondary | ICD-10-CM | POA: Diagnosis not present

## 2016-11-29 DIAGNOSIS — M531 Cervicobrachial syndrome: Secondary | ICD-10-CM | POA: Diagnosis not present

## 2017-01-09 DIAGNOSIS — F3112 Bipolar disorder, current episode manic without psychotic features, moderate: Secondary | ICD-10-CM | POA: Diagnosis not present

## 2017-02-14 DIAGNOSIS — F3112 Bipolar disorder, current episode manic without psychotic features, moderate: Secondary | ICD-10-CM | POA: Diagnosis not present

## 2017-02-27 DIAGNOSIS — M9903 Segmental and somatic dysfunction of lumbar region: Secondary | ICD-10-CM | POA: Diagnosis not present

## 2017-02-27 DIAGNOSIS — M531 Cervicobrachial syndrome: Secondary | ICD-10-CM | POA: Diagnosis not present

## 2017-02-27 DIAGNOSIS — M9902 Segmental and somatic dysfunction of thoracic region: Secondary | ICD-10-CM | POA: Diagnosis not present

## 2017-02-27 DIAGNOSIS — M9901 Segmental and somatic dysfunction of cervical region: Secondary | ICD-10-CM | POA: Diagnosis not present

## 2017-03-29 DIAGNOSIS — Z0000 Encounter for general adult medical examination without abnormal findings: Secondary | ICD-10-CM | POA: Diagnosis not present

## 2017-03-29 DIAGNOSIS — Z1231 Encounter for screening mammogram for malignant neoplasm of breast: Secondary | ICD-10-CM | POA: Diagnosis not present

## 2017-03-29 DIAGNOSIS — Z6829 Body mass index (BMI) 29.0-29.9, adult: Secondary | ICD-10-CM | POA: Diagnosis not present

## 2017-03-29 DIAGNOSIS — E785 Hyperlipidemia, unspecified: Secondary | ICD-10-CM | POA: Diagnosis not present

## 2017-03-29 DIAGNOSIS — Z01419 Encounter for gynecological examination (general) (routine) without abnormal findings: Secondary | ICD-10-CM | POA: Diagnosis not present

## 2017-03-29 LAB — HM PAP SMEAR: HM Pap smear: NEGATIVE

## 2017-03-29 LAB — HM MAMMOGRAPHY

## 2017-04-06 DIAGNOSIS — M9903 Segmental and somatic dysfunction of lumbar region: Secondary | ICD-10-CM | POA: Diagnosis not present

## 2017-04-06 DIAGNOSIS — M9901 Segmental and somatic dysfunction of cervical region: Secondary | ICD-10-CM | POA: Diagnosis not present

## 2017-04-06 DIAGNOSIS — M9902 Segmental and somatic dysfunction of thoracic region: Secondary | ICD-10-CM | POA: Diagnosis not present

## 2017-04-06 DIAGNOSIS — M531 Cervicobrachial syndrome: Secondary | ICD-10-CM | POA: Diagnosis not present

## 2017-04-11 ENCOUNTER — Other Ambulatory Visit: Payer: Self-pay | Admitting: Obstetrics and Gynecology

## 2017-04-11 DIAGNOSIS — Z803 Family history of malignant neoplasm of breast: Secondary | ICD-10-CM

## 2017-04-16 DIAGNOSIS — F3112 Bipolar disorder, current episode manic without psychotic features, moderate: Secondary | ICD-10-CM | POA: Diagnosis not present

## 2017-04-26 ENCOUNTER — Other Ambulatory Visit: Payer: BLUE CROSS/BLUE SHIELD

## 2017-05-29 DIAGNOSIS — F319 Bipolar disorder, unspecified: Secondary | ICD-10-CM | POA: Diagnosis not present

## 2017-06-12 ENCOUNTER — Ambulatory Visit (INDEPENDENT_AMBULATORY_CARE_PROVIDER_SITE_OTHER): Payer: BLUE CROSS/BLUE SHIELD | Admitting: Family Medicine

## 2017-06-12 ENCOUNTER — Encounter: Payer: Self-pay | Admitting: Family Medicine

## 2017-06-12 VITALS — BP 102/71 | HR 100 | Temp 97.9°F | Resp 20 | Ht 65.0 in | Wt 165.5 lb

## 2017-06-12 DIAGNOSIS — G4700 Insomnia, unspecified: Secondary | ICD-10-CM

## 2017-06-12 DIAGNOSIS — F419 Anxiety disorder, unspecified: Secondary | ICD-10-CM | POA: Diagnosis not present

## 2017-06-12 DIAGNOSIS — E785 Hyperlipidemia, unspecified: Secondary | ICD-10-CM | POA: Diagnosis not present
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
## 2017-06-12 NOTE — Patient Instructions (Signed)
Nice to meet you.   Start exercising averaging 150 minutes a week to start...ease into this.   Avoid alcohol, fatty foods, and sugars. More fiber, vegetables and lean proteins.    Fasting labs within 1 week.   Food Choices to Lower Your Triglycerides Triglycerides are a type of fat in your blood. High levels of triglycerides can increase the risk of heart disease and stroke. If your triglyceride levels are high, the foods you eat and your eating habits are very important. Choosing the right foods can help lower your triglycerides. What general guidelines do I need to follow?  Lose weight if you are overweight.  Limit or avoid alcohol.  Fill one half of your plate with vegetables and green salads.  Limit fruit to two servings a day. Choose fruit instead of juice.  Make one fourth of your plate whole grains. Look for the word "whole" as the first word in the ingredient list.  Fill one fourth of your plate with lean protein foods.  Enjoy fatty fish (such as salmon, mackerel, sardines, and tuna) three times a week.  Choose healthy fats.  Limit foods high in starch and sugar.  Eat more home-cooked food and less restaurant, buffet, and fast food.  Limit fried foods.  Cook foods using methods other than frying.  Limit saturated fats.  Check ingredient lists to avoid foods with partially hydrogenated oils (trans fats) in them. What foods can I eat? Grains Whole grains, such as whole wheat or whole grain breads, crackers, cereals, and pasta. Unsweetened oatmeal, bulgur, barley, quinoa, or brown rice. Corn or whole wheat flour tortillas. Vegetables Fresh or frozen vegetables (raw, steamed, roasted, or grilled). Green salads. Fruits All fresh, canned (in natural juice), or frozen fruits. Meat and Other Protein Products Ground beef (85% or leaner), grass-fed beef, or beef trimmed of fat. Skinless chicken or [place]. Ground chicken or [place]. Pork trimmed of fat. All fish and  seafood. Eggs. Dried beans, peas, or lentils. Unsalted nuts or seeds. Unsalted canned or dry beans. Dairy Low-fat dairy products, such as skim or 1% milk, 2% or reduced-fat cheeses, low-fat ricotta or cottage cheese, or plain low-fat yogurt. Fats and Oils Tub margarines without trans fats. Light or reduced-fat mayonnaise and salad dressings. Avocado. Safflower, olive, or canola oils. Natural peanut or almond butter. The items listed above may not be a complete list of recommended foods or beverages. Contact your dietitian for more options. What foods are not recommended? Grains White bread. White pasta. White rice. Cornbread. Bagels, pastries, and croissants. Crackers that contain trans fat. Vegetables White potatoes. Corn. Creamed or fried vegetables. Vegetables in a cheese sauce. Fruits Dried fruits. Canned fruit in light or heavy syrup. Fruit juice. Meat and Other Protein Products Fatty cuts of meat. Ribs, chicken wings, bacon, sausage, bologna, salami, chitterlings, fatback, hot dogs, bratwurst, and packaged luncheon meats. Dairy Whole or 2% milk, cream, half-and-half, and cream cheese. Whole-fat or sweetened yogurt. Full-fat cheeses. Nondairy creamers and whipped toppings. Processed cheese, cheese spreads, or cheese curds. Sweets and Desserts Corn syrup, sugars, honey, and molasses. Candy. Jam and jelly. Syrup. Sweetened cereals. Cookies, pies, cakes, donuts, muffins, and ice cream. Fats and Oils Butter, stick margarine, lard, shortening, ghee, or bacon fat. Coconut, palm kernel, or palm oils. Beverages Alcohol. Sweetened drinks (such as sodas, lemonade, and fruit drinks or punches). The items listed above may not be a complete list of foods and beverages to avoid. Contact your dietitian for more information. This information is not intended  to replace advice given to you by your health care provider. Make sure you discuss any questions you have with your health care provider. Document  Released: 08/10/2004 Document Revised: 03/30/2016 Document Reviewed: 08/27/2013 Elsevier Interactive Patient Education  2017 [employer].

## 2017-06-13 ENCOUNTER — Other Ambulatory Visit (INDEPENDENT_AMBULATORY_CARE_PROVIDER_SITE_OTHER): Payer: BLUE CROSS/BLUE SHIELD

## 2017-06-13 DIAGNOSIS — F319 Bipolar disorder, unspecified: Secondary | ICD-10-CM | POA: Diagnosis not present

## 2017-06-13 DIAGNOSIS — E785 Hyperlipidemia, unspecified: Secondary | ICD-10-CM | POA: Diagnosis not present

## 2017-06-13 LAB — LIPID PANEL
Cholesterol: 210 mg/dL — ABNORMAL HIGH (ref 0–200)
HDL: 41.9 mg/dL (ref >39.00)
NonHDL: 167.81
Total CHOL/HDL Ratio: 5
Triglycerides: 308 mg/dL — ABNORMAL HIGH (ref 0.0–149.0)
VLDL: 61.6 mg/dL — ABNORMAL HIGH (ref 0.0–40.0)

## 2017-06-13 LAB — TSH: TSH: 1.5 u[IU]/mL (ref 0.35–4.50)

## 2017-06-13 LAB — LDL CHOLESTEROL, DIRECT: Direct LDL: 125 mg/dL

## 2017-06-15 ENCOUNTER — Encounter: Payer: Self-pay | Admitting: Family Medicine

## 2017-06-15 ENCOUNTER — Telehealth: Payer: Self-pay | Admitting: Family Medicine

## 2017-06-15 DIAGNOSIS — E781 Pure hyperglyceridemia: Secondary | ICD-10-CM

## 2017-06-15 DIAGNOSIS — G4700 Insomnia, unspecified: Secondary | ICD-10-CM | POA: Insufficient documentation

## 2017-06-15 DIAGNOSIS — F419 Anxiety disorder, unspecified: Secondary | ICD-10-CM | POA: Insufficient documentation

## 2017-06-15 MED ORDER — FENOFIBRATE 145 MG PO TABS: 145.0000 mg | ORAL_TABLET | Freq: Every day | ORAL | 1 refills | Status: DC

## 2017-06-15 NOTE — Telephone Encounter (Signed)
Patient notified and verbalized understanding. Requested a copy of her labs and will pick up Monday. She will schedule an appointment at that time.

## 2017-06-15 NOTE — Telephone Encounter (Signed)
Message left on voice mail for patient to return call. 

## 2017-06-15 NOTE — Telephone Encounter (Signed)
Please call patient: - Her triglycerides are elevated at 300. Ideally we would like beats below 150.  - Since she is allergic to many fish, she likely would not be a candidate to try fish oil supplement. - Increase the fiber in her diet, more fresh vegetables, lean or meat. - I would like to start a medication called fenofibrate. It is not a statin (like Lipitor etc). It is a daily med, and we would need to follow up in 3 months to recheck fasting lipid panel.  - I have called this in for her, please have her make appt in 3 months withy provider.

## 2017-06-19 ENCOUNTER — Telehealth: Payer: Self-pay | Admitting: Family Medicine

## 2017-06-19 DIAGNOSIS — F319 Bipolar disorder, unspecified: Secondary | ICD-10-CM | POA: Diagnosis not present

## 2017-06-19 NOTE — Telephone Encounter (Signed)
I have not seen documents yet. Will respond once available

## 2017-06-19 NOTE — Telephone Encounter (Signed)
Amendment request received on June 19, 2017. Documents forwarded to Danise EdgeStacey Blyth MD via interoffice mail. daj

## 2017-07-03 DIAGNOSIS — G4733 Obstructive sleep apnea (adult) (pediatric): Secondary | ICD-10-CM | POA: Diagnosis not present

## 2017-07-16 ENCOUNTER — Encounter: Payer: Self-pay | Admitting: Family Medicine

## 2017-07-16 ENCOUNTER — Ambulatory Visit (INDEPENDENT_AMBULATORY_CARE_PROVIDER_SITE_OTHER): Payer: BLUE CROSS/BLUE SHIELD | Admitting: Family Medicine

## 2017-07-16 VITALS — BP 102/68 | HR 90 | Temp 98.5°F | Resp 20 | Wt 162.5 lb

## 2017-07-16 DIAGNOSIS — J40 Bronchitis, not specified as acute or chronic: Secondary | ICD-10-CM | POA: Diagnosis not present

## 2017-07-16 DIAGNOSIS — R05 Cough: Secondary | ICD-10-CM

## 2017-07-16 DIAGNOSIS — R059 Cough, unspecified: Secondary | ICD-10-CM

## 2017-07-16 MED ORDER — AZITHROMYCIN 250 MG PO TABS: ORAL_TABLET | ORAL | 0 refills | Status: DC

## 2017-07-16 MED ORDER — BENZONATATE 100 MG PO CAPS: 100.0000 mg | ORAL_CAPSULE | Freq: Two times a day (BID) | ORAL | 0 refills | Status: DC | PRN
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
## 2017-07-16 NOTE — Patient Instructions (Addendum)
Rest, hydrate.  mucinex (DM if cough), nettie pot or nasal saline.  Zpack prescribed, take until completed.  Tessalon Perles, also for cough.  If cough present it can last up to 6-8 weeks.  F/U 2 weeks of not improved.     Pertussis, Adult Pertussis, also called whooping cough, is an infection that causes severe and sudden coughing attacks. Pertussis spreads easily from person to person (is contagious). It spreads through the droplets that are sprayed in the air when an infected person talks, coughs, or sneezes. Symptoms of pertussis can last for up to 6 weeks, even though the cough starts to get better. It may take as long as 6 months for the cough to go away completely. What are the causes? This condition is caused by bacteria. The bacteria can spread to someone when he or she:  Inhales droplets that have been sprayed in the air by an infected person.  Touches a surface where the droplets fell and then touches his or her mouth or nose.  What are the signs or symptoms? Symptoms of this condition include cold-like symptoms, such as:  A runny nose.  Low fever.  Mild cough.  Red, watery eyes.  These symptoms develop at the beginning of the infection. After 1-2 weeks, the cold symptoms get better, but the cough worsens, and severe and sudden coughing attacks frequently develop. During these attacks, people may cough so hard that vomiting occurs.  How is this treated? This condition is treated with antibiotic medicines.  Starting antibiotics quickly may help shorten the illness and make it less contagious.  Antibiotics may also be prescribed for everyone who lives in the same household.   Immunization may be recommended for people in the household who are at risk of developing pertussis. At-risk groups include: ? Infants. ? Those who have not had their full course of pertussis immunizations. ? Those who were immunized but have not had their recent booster shot. Mild coughing may  continue for months after the infection is treated. This coughing may be due to the remaining soreness and inflammation in the lungs. Follow these instructions at home: Medicines   Take over-the-counter and prescription medicines only as told by your health care provider.  Take your antibiotic medicine as told by your health care provider. Do not stop taking the antibiotic even if you start to feel better.  Do not take cough medicine unless told by your health care provider. Coughing attack  If you are having a coughing attack: ? Raise (elevate) the head of your mattress or raise your head with pillows to improve breathing. This will also make it easier to clear out mucus that is brought up from the lungs to the throat during a cough (sputum). ? Sit upright.  Avoid substances that may irritate the lungs, such as smoke, aerosols, or fumes. These substances may make your coughing worse.  Use a cool mist humidifier at home to increase air moisture. This will soothe your cough and help to loosen sputum. Do not use hot steam. Prevent infection  For the first 5 days of antibiotic treatment, stay away from those who are at risk of developing pertussis. If no antibiotics are prescribed, stay at home for the first 3 weeks that you are coughing or as told by your health care provider.  Do not go to work until you have been treated with antibiotics for 5 days. If no antibiotics are prescribed, do not go to work for the first 3 weeks that  you are coughing or as told by your health care provider. Tell your workplace that you were diagnosed with pertussis.  Wash your hands often with soap and water. Everyone in your household should also wash hands often to avoid spreading the infection. If soap and water are not available, use hand sanitizer. General instructions  Rest as much as possible. Slowly go back to your normal activities as told by your health care provider.  Drink enough fluid to keep your  urine clear or pale yellow.  Keep all follow-up visits as told by your health care provider. This is important. How is this prevented?  Pertussis can be prevented with a vaccine and later booster shots.  The pertussis vaccine is given during childhood.  If you are an adult who was never vaccinated, get vaccinated as soon as possible.  If you are an adult who was previously vaccinated, talk with your health care providers about the need for a booster shot because immunity from the vaccine decreases over time.  All of the following people should consider receiving a booster dose of pertussis: ? Pregnant women. ? Everyone who has or will have close contact with an infant who is less than 11 months old. ? All health care personnel. Contact a health care provider if:  You cannot stop vomiting.  You are not able to eat or drink.  Your cough does not improve.  You have a fever. Get help right away if:  Your face turns red or blue during a coughing attack.  You pass out after a coughing attack, even if only for a few moments.  Your breathing stops for a period of time (apnea).  You are restless or cannot sleep.  You feel sluggish or you are sleeping too much. This information is not intended to replace advice given to you by your health care provider. Make sure you discuss any questions you have with your health care provider. Document Released: 02/17/2013 Document Revised: 05/13/2016 Document Reviewed: 04/10/2016 Elsevier Interactive Patient Education  [employer].

## 2017-07-18 ENCOUNTER — Telehealth: Payer: Self-pay | Admitting: Family Medicine

## 2017-07-18 ENCOUNTER — Encounter: Payer: Self-pay | Admitting: Family Medicine

## 2017-07-18 ENCOUNTER — Ambulatory Visit (INDEPENDENT_AMBULATORY_CARE_PROVIDER_SITE_OTHER): Payer: BLUE CROSS/BLUE SHIELD | Admitting: Family Medicine

## 2017-07-18 VITALS — BP 109/77 | HR 82 | Temp 98.0°F | Resp 20 | Wt 160.5 lb

## 2017-07-18 DIAGNOSIS — R059 Cough, unspecified: Secondary | ICD-10-CM

## 2017-07-18 DIAGNOSIS — R6889 Other general symptoms and signs: Secondary | ICD-10-CM | POA: Diagnosis not present

## 2017-07-18 DIAGNOSIS — R05 Cough: Secondary | ICD-10-CM

## 2017-07-18 DIAGNOSIS — J40 Bronchitis, not specified as acute or chronic: Secondary | ICD-10-CM | POA: Diagnosis not present

## 2017-07-18 DIAGNOSIS — F319 Bipolar disorder, unspecified: Secondary | ICD-10-CM | POA: Diagnosis not present

## 2017-07-18 MED ORDER — METHYLPREDNISOLONE ACETATE 80 MG/ML IJ SUSP
80.0000 mg | Freq: Once | INTRAMUSCULAR | Status: AC
Start: 2017-07-18 — End: 2017-07-18
  Administered 2017-07-18: 80 mg via INTRAMUSCULAR

## 2017-07-18 NOTE — Telephone Encounter (Signed)
Spoke with patient let her know she needs to keep provider appt and must be fasting.

## 2017-07-18 NOTE — Telephone Encounter (Signed)
Please call patient about November appt. She would like to change her appointment to a lab appointment.
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
## 2017-07-18 NOTE — Patient Instructions (Signed)
Rest. Hydrate. Continue z-pack. Steroid shot today to help with illness.  Flu is negative.    Acute Bronchitis, Adult Acute bronchitis is when air tubes (bronchi) in the lungs suddenly get swollen. The condition can make it hard to breathe. It can also cause these symptoms:  A cough.  Coughing up clear, yellow, or green mucus.  Wheezing.  Chest congestion.  Shortness of breath.  A fever.  Body aches.  Chills.  A sore throat.  Follow these instructions at home: Medicines  Take over-the-counter and prescription medicines only as told by your doctor.  If you were prescribed an antibiotic medicine, take it as told by your doctor. Do not stop taking the antibiotic even if you start to feel better. General instructions  Rest.  Drink enough fluids to keep your pee (urine) clear or pale yellow.  Avoid smoking and secondhand smoke. If you smoke and you need help quitting, ask your doctor. Quitting will help your lungs heal faster.  Use an inhaler, cool mist vaporizer, or humidifier as told by your doctor.  Keep all follow-up visits as told by your doctor. This is important. How is this prevented? To lower your risk of getting this condition again:  Wash your hands often with soap and water. If you cannot use soap and water, use hand sanitizer.  Avoid contact with people who have cold symptoms.  Try not to touch your hands to your mouth, nose, or eyes.  Make sure to get the flu shot every year.  Contact a doctor if:  Your symptoms do not get better in 2 weeks. Get help right away if:  You cough up blood.  You have chest pain.  You have very bad shortness of breath.  You become dehydrated.  You faint (pass out) or keep feeling like you are going to pass out.  You keep throwing up (vomiting).  You have a very bad headache.  Your fever or chills gets worse. This information is not intended to replace advice given to you by your health care provider. Make  sure you discuss any questions you have with your health care provider. Document Released: 04/10/2008 Document Revised: 05/31/2016 Document Reviewed: 04/12/2016 Elsevier Interactive Patient Education  2017 [employer]

## 2017-07-19 LAB — POC INFLUENZA A&B (BINAX/QUICKVUE)
INFLUENZA A, POC: NEGATIVE
INFLUENZA B, POC: NEGATIVE

## 2017-07-25 ENCOUNTER — Other Ambulatory Visit: Payer: Self-pay | Admitting: *Deleted

## 2017-07-25 DIAGNOSIS — R059 Cough, unspecified: Secondary | ICD-10-CM

## 2017-07-25 DIAGNOSIS — R05 Cough: Secondary | ICD-10-CM

## 2017-07-25 MED ORDER — BENZONATATE 100 MG PO CAPS: 100.0000 mg | ORAL_CAPSULE | Freq: Two times a day (BID) | ORAL | 0 refills | Status: DC | PRN

## 2017-09-12 ENCOUNTER — Ambulatory Visit: Payer: BLUE CROSS/BLUE SHIELD | Admitting: Family Medicine

## 2017-09-12 ENCOUNTER — Encounter: Payer: Self-pay | Admitting: Family Medicine

## 2017-09-12 VITALS — BP 92/61 | HR 77 | Temp 97.7°F | Resp 20 | Ht 65.0 in | Wt 148.0 lb

## 2017-09-12 DIAGNOSIS — E781 Pure hyperglyceridemia: Secondary | ICD-10-CM | POA: Insufficient documentation

## 2017-09-12 DIAGNOSIS — E785 Hyperlipidemia, unspecified: Secondary | ICD-10-CM

## 2017-09-12 DIAGNOSIS — F319 Bipolar disorder, unspecified: Secondary | ICD-10-CM | POA: Diagnosis not present

## 2017-09-12 LAB — LIPID PANEL
CHOL/HDL RATIO: 3
Cholesterol: 167 mg/dL (ref 0–200)
HDL: 58.3 mg/dL (ref >39.00)
LDL CALC: 97 mg/dL (ref 0–99)
NONHDL: 108.57
Triglycerides: 59 mg/dL (ref 0.0–149.0)
VLDL: 11.8 mg/dL (ref 0.0–40.0)

## 2017-09-12 LAB — COMPREHENSIVE METABOLIC PANEL
ALT: 16 U/L (ref 0–35)
AST: 16 U/L (ref 0–37)
Albumin: 4.4 g/dL (ref 3.5–5.2)
Alkaline Phosphatase: 35 U/L — ABNORMAL LOW (ref 39–117)
BILIRUBIN TOTAL: 0.4 mg/dL (ref 0.2–1.2)
BUN: 11 mg/dL (ref 6–23)
CHLORIDE: 106 meq/L (ref 96–112)
CO2: 28 meq/L (ref 19–32)
Calcium: 10.4 mg/dL (ref 8.4–10.5)
Creatinine, Ser: 1 mg/dL (ref 0.40–1.20)
GFR: 62.8 mL/min (ref >60.00)
Glucose, Bld: 85 mg/dL (ref 70–99)
POTASSIUM: 4.7 meq/L (ref 3.5–5.1)
Sodium: 140 mEq/L (ref 135–145)
Total Protein: 6.8 g/dL (ref 6.0–8.3)

## 2017-09-12 MED ORDER — FENOFIBRATE 145 MG PO TABS: 145.0000 mg | ORAL_TABLET | Freq: Every day | ORAL | 3 refills | Status: DC

## 2017-09-12 NOTE — Patient Instructions (Signed)
We will call you with lab results once available. Exercising helps both with cholesterol and anxiety.   Food Choices to Lower Your Triglycerides Triglycerides are a type of fat in your blood. High levels of triglycerides can increase the risk of heart disease and stroke. If your triglyceride levels are high, the foods you eat and your eating habits are very important. Choosing the right foods can help lower your triglycerides. What general guidelines do I need to follow?  Lose weight if you are overweight.  Limit or avoid alcohol.  Fill one half of your plate with vegetables and green salads.  Limit fruit to two servings a day. Choose fruit instead of juice.  Make one fourth of your plate whole grains. Look for the word "whole" as the first word in the ingredient list.  Fill one fourth of your plate with lean protein foods.  Enjoy fatty fish (such as salmon, mackerel, sardines, and tuna) three times a week.  Choose healthy fats.  Limit foods high in starch and sugar.  Eat more home-cooked food and less restaurant, buffet, and fast food.  Limit fried foods.  Cook foods using methods other than frying.  Limit saturated fats.  Check ingredient lists to avoid foods with partially hydrogenated oils (trans fats) in them. What foods can I eat? Grains Whole grains, such as whole wheat or whole grain breads, crackers, cereals, and pasta. Unsweetened oatmeal, bulgur, barley, quinoa, or brown rice. Corn or whole wheat flour tortillas. Vegetables Fresh or frozen vegetables (raw, steamed, roasted, or grilled). Green salads. Fruits All fresh, canned (in natural juice), or frozen fruits. Meat and Other Protein Products Ground beef (85% or leaner), grass-fed beef, or beef trimmed of fat. Skinless chicken or [place]. Ground chicken or [place]. Pork trimmed of fat. All fish and seafood. Eggs. Dried beans, peas, or lentils. Unsalted nuts or seeds. Unsalted canned or dry beans. Dairy Low-fat  dairy products, such as skim or 1% milk, 2% or reduced-fat cheeses, low-fat ricotta or cottage cheese, or plain low-fat yogurt. Fats and Oils Tub margarines without trans fats. Light or reduced-fat mayonnaise and salad dressings. Avocado. Safflower, olive, or canola oils. Natural peanut or almond butter. The items listed above may not be a complete list of recommended foods or beverages. Contact your dietitian for more options. What foods are not recommended? Grains White bread. White pasta. White rice. Cornbread. Bagels, pastries, and croissants. Crackers that contain trans fat. Vegetables White potatoes. Corn. Creamed or fried vegetables. Vegetables in a cheese sauce. Fruits Dried fruits. Canned fruit in light or heavy syrup. Fruit juice. Meat and Other Protein Products Fatty cuts of meat. Ribs, chicken wings, bacon, sausage, bologna, salami, chitterlings, fatback, hot dogs, bratwurst, and packaged luncheon meats. Dairy Whole or 2% milk, cream, half-and-half, and cream cheese. Whole-fat or sweetened yogurt. Full-fat cheeses. Nondairy creamers and whipped toppings. Processed cheese, cheese spreads, or cheese curds. Sweets and Desserts Corn syrup, sugars, honey, and molasses. Candy. Jam and jelly. Syrup. Sweetened cereals. Cookies, pies, cakes, donuts, muffins, and ice cream. Fats and Oils Butter, stick margarine, lard, shortening, ghee, or bacon fat. Coconut, palm kernel, or palm oils. Beverages Alcohol. Sweetened drinks (such as sodas, lemonade, and fruit drinks or punches). The items listed above may not be a complete list of foods and beverages to avoid. Contact your dietitian for more information. This information is not intended to replace advice given to you by your health care provider. Make sure you discuss any questions you have with your health care  provider. Document Released: 08/10/2004 Document Revised: 03/30/2016 Document Reviewed: 08/27/2013 Elsevier Interactive Patient  Education  2017 [employer]
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
## 2017-10-10 DIAGNOSIS — M9901 Segmental and somatic dysfunction of cervical region: Secondary | ICD-10-CM | POA: Diagnosis not present

## 2017-10-10 DIAGNOSIS — M9903 Segmental and somatic dysfunction of lumbar region: Secondary | ICD-10-CM | POA: Diagnosis not present

## 2017-10-10 DIAGNOSIS — M531 Cervicobrachial syndrome: Secondary | ICD-10-CM | POA: Diagnosis not present

## 2017-10-10 DIAGNOSIS — M9902 Segmental and somatic dysfunction of thoracic region: Secondary | ICD-10-CM | POA: Diagnosis not present

## 2017-10-17 DIAGNOSIS — F319 Bipolar disorder, unspecified: Secondary | ICD-10-CM | POA: Diagnosis not present

## 2017-10-18 DIAGNOSIS — F319 Bipolar disorder, unspecified: Secondary | ICD-10-CM | POA: Diagnosis not present

## 2017-10-19 DIAGNOSIS — M9901 Segmental and somatic dysfunction of cervical region: Secondary | ICD-10-CM | POA: Diagnosis not present

## 2017-10-19 DIAGNOSIS — M9902 Segmental and somatic dysfunction of thoracic region: Secondary | ICD-10-CM | POA: Diagnosis not present

## 2017-10-19 DIAGNOSIS — M9903 Segmental and somatic dysfunction of lumbar region: Secondary | ICD-10-CM | POA: Diagnosis not present

## 2017-10-19 DIAGNOSIS — M531 Cervicobrachial syndrome: Secondary | ICD-10-CM | POA: Diagnosis not present

## 2017-10-26 DIAGNOSIS — M9903 Segmental and somatic dysfunction of lumbar region: Secondary | ICD-10-CM | POA: Diagnosis not present

## 2017-10-26 DIAGNOSIS — M9901 Segmental and somatic dysfunction of cervical region: Secondary | ICD-10-CM | POA: Diagnosis not present

## 2017-10-26 DIAGNOSIS — M9902 Segmental and somatic dysfunction of thoracic region: Secondary | ICD-10-CM | POA: Diagnosis not present

## 2017-10-26 DIAGNOSIS — M531 Cervicobrachial syndrome: Secondary | ICD-10-CM | POA: Diagnosis not present

## 2017-11-02 DIAGNOSIS — M9903 Segmental and somatic dysfunction of lumbar region: Secondary | ICD-10-CM | POA: Diagnosis not present

## 2017-11-02 DIAGNOSIS — M531 Cervicobrachial syndrome: Secondary | ICD-10-CM | POA: Diagnosis not present

## 2017-11-02 DIAGNOSIS — M9902 Segmental and somatic dysfunction of thoracic region: Secondary | ICD-10-CM | POA: Diagnosis not present

## 2017-11-02 DIAGNOSIS — M9901 Segmental and somatic dysfunction of cervical region: Secondary | ICD-10-CM | POA: Diagnosis not present

## 2017-11-09 DIAGNOSIS — M9902 Segmental and somatic dysfunction of thoracic region: Secondary | ICD-10-CM | POA: Diagnosis not present

## 2017-11-09 DIAGNOSIS — M531 Cervicobrachial syndrome: Secondary | ICD-10-CM | POA: Diagnosis not present

## 2017-11-09 DIAGNOSIS — M9903 Segmental and somatic dysfunction of lumbar region: Secondary | ICD-10-CM | POA: Diagnosis not present

## 2017-11-09 DIAGNOSIS — M9901 Segmental and somatic dysfunction of cervical region: Secondary | ICD-10-CM | POA: Diagnosis not present

## 2017-11-16 DIAGNOSIS — M9903 Segmental and somatic dysfunction of lumbar region: Secondary | ICD-10-CM | POA: Diagnosis not present

## 2017-11-16 DIAGNOSIS — M9901 Segmental and somatic dysfunction of cervical region: Secondary | ICD-10-CM | POA: Diagnosis not present

## 2017-11-16 DIAGNOSIS — M531 Cervicobrachial syndrome: Secondary | ICD-10-CM | POA: Diagnosis not present

## 2017-11-16 DIAGNOSIS — M9902 Segmental and somatic dysfunction of thoracic region: Secondary | ICD-10-CM | POA: Diagnosis not present

## 2017-11-19 ENCOUNTER — Telehealth: Payer: Self-pay | Admitting: Family Medicine

## 2017-11-19 ENCOUNTER — Ambulatory Visit: Payer: Self-pay | Admitting: *Deleted

## 2017-11-19 NOTE — Telephone Encounter (Signed)
Patient phoned to request a print out of all of her vaccines listed.  She will also bring in a copy of her chicken pox titer and it will need to be added to print out, please.

## 2017-11-19 NOTE — Telephone Encounter (Signed)
Opened in error. M.Carmelo Reidel,RN

## 2017-11-20 NOTE — Telephone Encounter (Signed)
Spoke with patient she will pick up copy at her scheduled appt

## 2017-11-21 ENCOUNTER — Encounter: Payer: Self-pay | Admitting: Family Medicine

## 2017-11-21 ENCOUNTER — Ambulatory Visit (INDEPENDENT_AMBULATORY_CARE_PROVIDER_SITE_OTHER): Payer: BLUE CROSS/BLUE SHIELD | Admitting: Family Medicine

## 2017-11-21 VITALS — BP 88/65 | HR 83 | Temp 97.6°F | Ht 64.0 in | Wt 146.0 lb

## 2017-11-21 DIAGNOSIS — Z131 Encounter for screening for diabetes mellitus: Secondary | ICD-10-CM | POA: Diagnosis not present

## 2017-11-21 DIAGNOSIS — Z114 Encounter for screening for human immunodeficiency virus [HIV]: Secondary | ICD-10-CM | POA: Diagnosis not present

## 2017-11-21 DIAGNOSIS — Z0000 Encounter for general adult medical examination without abnormal findings: Secondary | ICD-10-CM | POA: Diagnosis not present

## 2017-11-21 DIAGNOSIS — E781 Pure hyperglyceridemia: Secondary | ICD-10-CM | POA: Diagnosis not present

## 2017-11-21 DIAGNOSIS — E785 Hyperlipidemia, unspecified: Secondary | ICD-10-CM

## 2017-11-21 DIAGNOSIS — R7309 Other abnormal glucose: Secondary | ICD-10-CM | POA: Insufficient documentation

## 2017-11-21 DIAGNOSIS — Z021 Encounter for pre-employment examination: Secondary | ICD-10-CM

## 2017-11-21 DIAGNOSIS — F419 Anxiety disorder, unspecified: Secondary | ICD-10-CM | POA: Diagnosis not present

## 2017-11-21 LAB — CBC WITH DIFFERENTIAL/PLATELET
Basophils Absolute: 0 10*3/uL (ref 0.0–0.1)
Basophils Relative: 0.4 % (ref 0.0–3.0)
EOS PCT: 1 % (ref 0.0–5.0)
Eosinophils Absolute: 0.1 10*3/uL (ref 0.0–0.7)
HCT: 36.4 % (ref 36.0–46.0)
Hemoglobin: 11.9 g/dL — ABNORMAL LOW (ref 12.0–15.0)
Lymphocytes Relative: 26.4 % (ref 12.0–46.0)
Lymphs Abs: 1.6 10*3/uL (ref 0.7–4.0)
MCHC: 32.6 g/dL (ref 30.0–36.0)
MCV: 94.4 fl (ref 78.0–100.0)
Monocytes Absolute: 0.5 10*3/uL (ref 0.1–1.0)
Monocytes Relative: 8 % (ref 3.0–12.0)
Neutro Abs: 3.9 10*3/uL (ref 1.4–7.7)
Neutrophils Relative %: 64.2 % (ref 43.0–77.0)
PLATELETS: 295 10*3/uL (ref 150.0–400.0)
RBC: 3.86 Mil/uL — ABNORMAL LOW (ref 3.87–5.11)
RDW: 12.8 % (ref 11.5–15.5)
WBC: 6.1 10*3/uL (ref 4.0–10.5)

## 2017-11-21 LAB — POC URINALSYSI DIPSTICK (AUTOMATED)
BILIRUBIN UA: NEGATIVE
GLUCOSE UA: NEGATIVE
Ketones, UA: NEGATIVE
Nitrite, UA: NEGATIVE
Protein, UA: NEGATIVE
RBC UA: NEGATIVE
SPEC GRAV UA: 1.025 (ref 1.010–1.025)
Urobilinogen, UA: 0.2 E.U./dL
pH, UA: 5.5 (ref 5.0–8.0)

## 2017-11-21 LAB — TSH: TSH: 2.55 u[IU]/mL (ref 0.35–4.50)

## 2017-11-21 LAB — HEMOGLOBIN A1C: HEMOGLOBIN A1C: 5.9 % (ref 4.6–6.5)

## 2017-11-21 MED ORDER — FENOFIBRATE 145 MG PO TABS: 145.0000 mg | ORAL_TABLET | Freq: Every day | ORAL | 11 refills | Status: DC

## 2017-11-21 NOTE — Patient Instructions (Signed)
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
## 2017-11-22 LAB — HIV ANTIBODY (ROUTINE TESTING W REFLEX): HIV: NONREACTIVE

## 2017-11-23 DIAGNOSIS — M9903 Segmental and somatic dysfunction of lumbar region: Secondary | ICD-10-CM | POA: Diagnosis not present

## 2017-11-23 DIAGNOSIS — M531 Cervicobrachial syndrome: Secondary | ICD-10-CM | POA: Diagnosis not present

## 2017-11-23 DIAGNOSIS — M9901 Segmental and somatic dysfunction of cervical region: Secondary | ICD-10-CM | POA: Diagnosis not present

## 2017-11-23 DIAGNOSIS — M9902 Segmental and somatic dysfunction of thoracic region: Secondary | ICD-10-CM | POA: Diagnosis not present

## 2017-11-26 DIAGNOSIS — M531 Cervicobrachial syndrome: Secondary | ICD-10-CM | POA: Diagnosis not present

## 2017-11-26 DIAGNOSIS — M9902 Segmental and somatic dysfunction of thoracic region: Secondary | ICD-10-CM | POA: Diagnosis not present

## 2017-11-26 DIAGNOSIS — M9901 Segmental and somatic dysfunction of cervical region: Secondary | ICD-10-CM | POA: Diagnosis not present

## 2017-11-26 DIAGNOSIS — M9903 Segmental and somatic dysfunction of lumbar region: Secondary | ICD-10-CM | POA: Diagnosis not present

## 2017-11-30 DIAGNOSIS — M9903 Segmental and somatic dysfunction of lumbar region: Secondary | ICD-10-CM | POA: Diagnosis not present

## 2017-11-30 DIAGNOSIS — M9902 Segmental and somatic dysfunction of thoracic region: Secondary | ICD-10-CM | POA: Diagnosis not present

## 2017-11-30 DIAGNOSIS — M9901 Segmental and somatic dysfunction of cervical region: Secondary | ICD-10-CM | POA: Diagnosis not present

## 2017-11-30 DIAGNOSIS — M531 Cervicobrachial syndrome: Secondary | ICD-10-CM | POA: Diagnosis not present

## 2017-12-03 DIAGNOSIS — M531 Cervicobrachial syndrome: Secondary | ICD-10-CM | POA: Diagnosis not present

## 2017-12-03 DIAGNOSIS — M9902 Segmental and somatic dysfunction of thoracic region: Secondary | ICD-10-CM | POA: Diagnosis not present

## 2017-12-03 DIAGNOSIS — M9903 Segmental and somatic dysfunction of lumbar region: Secondary | ICD-10-CM | POA: Diagnosis not present

## 2017-12-03 DIAGNOSIS — M9901 Segmental and somatic dysfunction of cervical region: Secondary | ICD-10-CM | POA: Diagnosis not present

## 2017-12-07 DIAGNOSIS — M531 Cervicobrachial syndrome: Secondary | ICD-10-CM | POA: Diagnosis not present

## 2017-12-07 DIAGNOSIS — M9903 Segmental and somatic dysfunction of lumbar region: Secondary | ICD-10-CM | POA: Diagnosis not present

## 2017-12-07 DIAGNOSIS — M9901 Segmental and somatic dysfunction of cervical region: Secondary | ICD-10-CM | POA: Diagnosis not present

## 2017-12-07 DIAGNOSIS — M9902 Segmental and somatic dysfunction of thoracic region: Secondary | ICD-10-CM | POA: Diagnosis not present

## 2017-12-11 ENCOUNTER — Ambulatory Visit: Payer: BLUE CROSS/BLUE SHIELD | Admitting: Family Medicine

## 2017-12-11 ENCOUNTER — Ambulatory Visit (HOSPITAL_BASED_OUTPATIENT_CLINIC_OR_DEPARTMENT_OTHER)
Admission: RE | Admit: 2017-12-11 | Discharge: 2017-12-11 | Disposition: A | Discharge status: Home / Self Care | Payer: BLUE CROSS/BLUE SHIELD | Source: Ambulatory Visit | Attending: Family Medicine | Admitting: Family Medicine

## 2017-12-11 ENCOUNTER — Telehealth: Payer: Self-pay | Admitting: Family Medicine

## 2017-12-11 ENCOUNTER — Encounter: Payer: Self-pay | Admitting: Family Medicine

## 2017-12-11 VITALS — BP 102/67 | HR 81 | Temp 98.1°F | Ht 64.0 in | Wt 144.5 lb

## 2017-12-11 DIAGNOSIS — M546 Pain in thoracic spine: Secondary | ICD-10-CM

## 2017-12-11 DIAGNOSIS — M542 Cervicalgia: Secondary | ICD-10-CM | POA: Diagnosis not present

## 2017-12-11 DIAGNOSIS — M5412 Radiculopathy, cervical region: Secondary | ICD-10-CM

## 2017-12-11 DIAGNOSIS — F319 Bipolar disorder, unspecified: Secondary | ICD-10-CM | POA: Diagnosis not present

## 2017-12-11 DIAGNOSIS — S299XXA Unspecified injury of thorax, initial encounter: Secondary | ICD-10-CM | POA: Diagnosis not present

## 2017-12-11 MED ORDER — METHYLPREDNISOLONE ACETATE 80 MG/ML IJ SUSP
80.0000 mg | Freq: Once | INTRAMUSCULAR | Status: AC
  Administered 2017-12-11: 80 mg via INTRAMUSCULAR

## 2017-12-11 MED ORDER — CYCLOBENZAPRINE HCL 10 MG PO TABS
10.0000 mg | ORAL_TABLET | Freq: Two times a day (BID) | ORAL | 0 refills | Status: DC | PRN
Start: 2017-12-11 — End: 2018-01-14

## 2017-12-11 MED ORDER — PREDNISONE 20 MG PO TABS
ORAL_TABLET | ORAL | 0 refills | Status: DC
Start: 2017-12-11 — End: 2018-01-14
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
## 2017-12-11 NOTE — Patient Instructions (Addendum)
I have ordered your xrays today, [facility]  Steroid injection today.  Prednisone taper and muscle relaxer.   If xray abnormal or pain not better in 3-4 weeks, refer to neurology.

## 2017-12-11 NOTE — Telephone Encounter (Signed)
Spoke with patient reviewed xray results ,instructions and information. Patient verbalized understanding. 

## 2017-12-11 NOTE — Telephone Encounter (Signed)
Please call pt: Her cervical and thoracic spine xrays were normal. No fractures, joint spaces are well preserved make a disc protrusion less likely.  Take medications as prescribed. If it does not calm down her discomfort follow up in 4 weeks.

## 2017-12-12 DIAGNOSIS — F319 Bipolar disorder, unspecified: Secondary | ICD-10-CM | POA: Diagnosis not present

## 2017-12-14 DIAGNOSIS — M9902 Segmental and somatic dysfunction of thoracic region: Secondary | ICD-10-CM | POA: Diagnosis not present

## 2017-12-14 DIAGNOSIS — M9901 Segmental and somatic dysfunction of cervical region: Secondary | ICD-10-CM | POA: Diagnosis not present

## 2017-12-14 DIAGNOSIS — D23122 Other benign neoplasm of skin of left lower eyelid, including canthus: Secondary | ICD-10-CM | POA: Diagnosis not present

## 2017-12-14 DIAGNOSIS — M9903 Segmental and somatic dysfunction of lumbar region: Secondary | ICD-10-CM | POA: Diagnosis not present

## 2017-12-14 DIAGNOSIS — M531 Cervicobrachial syndrome: Secondary | ICD-10-CM | POA: Diagnosis not present

## 2017-12-14 DIAGNOSIS — D492 Neoplasm of unspecified behavior of bone, soft tissue, and skin: Secondary | ICD-10-CM | POA: Diagnosis not present

## 2017-12-17 DIAGNOSIS — M9902 Segmental and somatic dysfunction of thoracic region: Secondary | ICD-10-CM | POA: Diagnosis not present

## 2017-12-17 DIAGNOSIS — M9901 Segmental and somatic dysfunction of cervical region: Secondary | ICD-10-CM | POA: Diagnosis not present

## 2017-12-17 DIAGNOSIS — M9903 Segmental and somatic dysfunction of lumbar region: Secondary | ICD-10-CM | POA: Diagnosis not present

## 2017-12-17 DIAGNOSIS — M531 Cervicobrachial syndrome: Secondary | ICD-10-CM | POA: Diagnosis not present

## 2017-12-21 DIAGNOSIS — M531 Cervicobrachial syndrome: Secondary | ICD-10-CM | POA: Diagnosis not present

## 2017-12-21 DIAGNOSIS — M9902 Segmental and somatic dysfunction of thoracic region: Secondary | ICD-10-CM | POA: Diagnosis not present

## 2017-12-21 DIAGNOSIS — M9901 Segmental and somatic dysfunction of cervical region: Secondary | ICD-10-CM | POA: Diagnosis not present

## 2017-12-21 DIAGNOSIS — M9903 Segmental and somatic dysfunction of lumbar region: Secondary | ICD-10-CM | POA: Diagnosis not present

## 2017-12-26 DIAGNOSIS — F319 Bipolar disorder, unspecified: Secondary | ICD-10-CM | POA: Diagnosis not present

## 2017-12-28 DIAGNOSIS — M9902 Segmental and somatic dysfunction of thoracic region: Secondary | ICD-10-CM | POA: Diagnosis not present

## 2017-12-28 DIAGNOSIS — M531 Cervicobrachial syndrome: Secondary | ICD-10-CM | POA: Diagnosis not present

## 2017-12-28 DIAGNOSIS — M9903 Segmental and somatic dysfunction of lumbar region: Secondary | ICD-10-CM | POA: Diagnosis not present

## 2017-12-28 DIAGNOSIS — M9901 Segmental and somatic dysfunction of cervical region: Secondary | ICD-10-CM | POA: Diagnosis not present

## 2018-01-04 DIAGNOSIS — M9902 Segmental and somatic dysfunction of thoracic region: Secondary | ICD-10-CM | POA: Diagnosis not present

## 2018-01-04 DIAGNOSIS — M9901 Segmental and somatic dysfunction of cervical region: Secondary | ICD-10-CM | POA: Diagnosis not present

## 2018-01-04 DIAGNOSIS — M531 Cervicobrachial syndrome: Secondary | ICD-10-CM | POA: Diagnosis not present

## 2018-01-04 DIAGNOSIS — M9903 Segmental and somatic dysfunction of lumbar region: Secondary | ICD-10-CM | POA: Diagnosis not present

## 2018-01-11 DIAGNOSIS — M531 Cervicobrachial syndrome: Secondary | ICD-10-CM | POA: Diagnosis not present

## 2018-01-11 DIAGNOSIS — M9901 Segmental and somatic dysfunction of cervical region: Secondary | ICD-10-CM | POA: Diagnosis not present

## 2018-01-11 DIAGNOSIS — M9903 Segmental and somatic dysfunction of lumbar region: Secondary | ICD-10-CM | POA: Diagnosis not present

## 2018-01-11 DIAGNOSIS — M9902 Segmental and somatic dysfunction of thoracic region: Secondary | ICD-10-CM | POA: Diagnosis not present

## 2018-01-14 ENCOUNTER — Ambulatory Visit: Payer: BLUE CROSS/BLUE SHIELD | Admitting: Family Medicine

## 2018-01-14 ENCOUNTER — Encounter: Payer: Self-pay | Admitting: Family Medicine

## 2018-01-14 VITALS — BP 97/64 | HR 98 | Temp 97.8°F | Ht 64.0 in | Wt 149.2 lb

## 2018-01-14 DIAGNOSIS — Z0184 Encounter for antibody response examination: Secondary | ICD-10-CM | POA: Diagnosis not present

## 2018-01-14 DIAGNOSIS — M542 Cervicalgia: Secondary | ICD-10-CM

## 2018-01-14 DIAGNOSIS — M9903 Segmental and somatic dysfunction of lumbar region: Secondary | ICD-10-CM | POA: Diagnosis not present

## 2018-01-14 DIAGNOSIS — M531 Cervicobrachial syndrome: Secondary | ICD-10-CM | POA: Diagnosis not present

## 2018-01-14 DIAGNOSIS — M9901 Segmental and somatic dysfunction of cervical region: Secondary | ICD-10-CM | POA: Diagnosis not present

## 2018-01-14 DIAGNOSIS — M9902 Segmental and somatic dysfunction of thoracic region: Secondary | ICD-10-CM | POA: Diagnosis not present

## 2018-01-14 MED ORDER — CYCLOBENZAPRINE HCL 5 MG PO TABS: 5.0000 mg | ORAL_TABLET | Freq: Two times a day (BID) | ORAL | 0 refills | Status: DC | PRN
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
## 2018-01-14 NOTE — Patient Instructions (Signed)
We will call you with titer results and print report for you after they are received.   Flexeril refilled for you.

## 2018-01-16 ENCOUNTER — Telehealth: Payer: Self-pay | Admitting: Family Medicine

## 2018-01-16 DIAGNOSIS — F319 Bipolar disorder, unspecified: Secondary | ICD-10-CM | POA: Diagnosis not present

## 2018-01-16 LAB — HEPATITIS B SURFACE ANTIBODY,QUALITATIVE: Hep B S Ab: REACTIVE — AB

## 2018-01-16 LAB — VARICELLA ZOSTER ANTIBODY, IGG: Varicella IgG: 1814 index

## 2018-01-16 NOTE — Telephone Encounter (Signed)
Spoke with patient reviewed lab . Patient verbalized understanding. Copy placed up front for pick up at patient request.

## 2018-01-16 NOTE — Telephone Encounter (Signed)
Please inform pt both titers are positive. We can either fax, she can pick them up or she can print [facility].

## 2018-01-17 DIAGNOSIS — M9901 Segmental and somatic dysfunction of cervical region: Secondary | ICD-10-CM | POA: Diagnosis not present

## 2018-01-17 DIAGNOSIS — M531 Cervicobrachial syndrome: Secondary | ICD-10-CM | POA: Diagnosis not present

## 2018-01-17 DIAGNOSIS — M9903 Segmental and somatic dysfunction of lumbar region: Secondary | ICD-10-CM | POA: Diagnosis not present

## 2018-01-17 DIAGNOSIS — M9902 Segmental and somatic dysfunction of thoracic region: Secondary | ICD-10-CM | POA: Diagnosis not present

## 2018-01-25 DIAGNOSIS — M9903 Segmental and somatic dysfunction of lumbar region: Secondary | ICD-10-CM | POA: Diagnosis not present

## 2018-01-25 DIAGNOSIS — M531 Cervicobrachial syndrome: Secondary | ICD-10-CM | POA: Diagnosis not present

## 2018-01-25 DIAGNOSIS — M9902 Segmental and somatic dysfunction of thoracic region: Secondary | ICD-10-CM | POA: Diagnosis not present

## 2018-01-25 DIAGNOSIS — M9901 Segmental and somatic dysfunction of cervical region: Secondary | ICD-10-CM | POA: Diagnosis not present

## 2018-02-01 DIAGNOSIS — M9903 Segmental and somatic dysfunction of lumbar region: Secondary | ICD-10-CM | POA: Diagnosis not present

## 2018-02-01 DIAGNOSIS — M531 Cervicobrachial syndrome: Secondary | ICD-10-CM | POA: Diagnosis not present

## 2018-02-01 DIAGNOSIS — M9901 Segmental and somatic dysfunction of cervical region: Secondary | ICD-10-CM | POA: Diagnosis not present

## 2018-02-01 DIAGNOSIS — M9902 Segmental and somatic dysfunction of thoracic region: Secondary | ICD-10-CM | POA: Diagnosis not present

## 2018-02-08 DIAGNOSIS — M9902 Segmental and somatic dysfunction of thoracic region: Secondary | ICD-10-CM | POA: Diagnosis not present

## 2018-02-08 DIAGNOSIS — M531 Cervicobrachial syndrome: Secondary | ICD-10-CM | POA: Diagnosis not present

## 2018-02-08 DIAGNOSIS — M9903 Segmental and somatic dysfunction of lumbar region: Secondary | ICD-10-CM | POA: Diagnosis not present

## 2018-02-08 DIAGNOSIS — M9901 Segmental and somatic dysfunction of cervical region: Secondary | ICD-10-CM | POA: Diagnosis not present

## 2018-02-14 DIAGNOSIS — M531 Cervicobrachial syndrome: Secondary | ICD-10-CM | POA: Diagnosis not present

## 2018-02-14 DIAGNOSIS — M9903 Segmental and somatic dysfunction of lumbar region: Secondary | ICD-10-CM | POA: Diagnosis not present

## 2018-02-14 DIAGNOSIS — M9901 Segmental and somatic dysfunction of cervical region: Secondary | ICD-10-CM | POA: Diagnosis not present

## 2018-02-14 DIAGNOSIS — M9902 Segmental and somatic dysfunction of thoracic region: Secondary | ICD-10-CM | POA: Diagnosis not present

## 2018-02-19 DIAGNOSIS — M9901 Segmental and somatic dysfunction of cervical region: Secondary | ICD-10-CM | POA: Diagnosis not present

## 2018-02-19 DIAGNOSIS — M9902 Segmental and somatic dysfunction of thoracic region: Secondary | ICD-10-CM | POA: Diagnosis not present

## 2018-02-19 DIAGNOSIS — M542 Cervicalgia: Secondary | ICD-10-CM | POA: Diagnosis not present

## 2018-02-19 DIAGNOSIS — M546 Pain in thoracic spine: Secondary | ICD-10-CM | POA: Diagnosis not present

## 2018-02-20 DIAGNOSIS — F319 Bipolar disorder, unspecified: Secondary | ICD-10-CM | POA: Diagnosis not present

## 2018-02-22 DIAGNOSIS — R252 Cramp and spasm: Secondary | ICD-10-CM | POA: Diagnosis not present

## 2018-02-27 DIAGNOSIS — F319 Bipolar disorder, unspecified: Secondary | ICD-10-CM | POA: Diagnosis not present

## 2018-03-06 DIAGNOSIS — F319 Bipolar disorder, unspecified: Secondary | ICD-10-CM | POA: Diagnosis not present

## 2018-03-06 DIAGNOSIS — M542 Cervicalgia: Secondary | ICD-10-CM | POA: Diagnosis not present

## 2018-03-06 DIAGNOSIS — M9901 Segmental and somatic dysfunction of cervical region: Secondary | ICD-10-CM | POA: Diagnosis not present

## 2018-03-06 DIAGNOSIS — M546 Pain in thoracic spine: Secondary | ICD-10-CM | POA: Diagnosis not present

## 2018-03-06 DIAGNOSIS — M9902 Segmental and somatic dysfunction of thoracic region: Secondary | ICD-10-CM | POA: Diagnosis not present

## 2018-03-20 DIAGNOSIS — M542 Cervicalgia: Secondary | ICD-10-CM | POA: Diagnosis not present

## 2018-03-20 DIAGNOSIS — F319 Bipolar disorder, unspecified: Secondary | ICD-10-CM | POA: Diagnosis not present

## 2018-03-20 DIAGNOSIS — M546 Pain in thoracic spine: Secondary | ICD-10-CM | POA: Diagnosis not present

## 2018-03-20 DIAGNOSIS — M9901 Segmental and somatic dysfunction of cervical region: Secondary | ICD-10-CM | POA: Diagnosis not present

## 2018-03-20 DIAGNOSIS — M9902 Segmental and somatic dysfunction of thoracic region: Secondary | ICD-10-CM | POA: Diagnosis not present

## 2018-04-03 DIAGNOSIS — M546 Pain in thoracic spine: Secondary | ICD-10-CM | POA: Diagnosis not present

## 2018-04-03 DIAGNOSIS — M542 Cervicalgia: Secondary | ICD-10-CM | POA: Diagnosis not present

## 2018-04-03 DIAGNOSIS — M9902 Segmental and somatic dysfunction of thoracic region: Secondary | ICD-10-CM | POA: Diagnosis not present

## 2018-04-03 DIAGNOSIS — F319 Bipolar disorder, unspecified: Secondary | ICD-10-CM | POA: Diagnosis not present

## 2018-04-03 DIAGNOSIS — M9901 Segmental and somatic dysfunction of cervical region: Secondary | ICD-10-CM | POA: Diagnosis not present

## 2018-04-10 DIAGNOSIS — M9901 Segmental and somatic dysfunction of cervical region: Secondary | ICD-10-CM | POA: Diagnosis not present

## 2018-04-10 DIAGNOSIS — M542 Cervicalgia: Secondary | ICD-10-CM | POA: Diagnosis not present

## 2018-04-10 DIAGNOSIS — M9902 Segmental and somatic dysfunction of thoracic region: Secondary | ICD-10-CM | POA: Diagnosis not present

## 2018-04-10 DIAGNOSIS — M546 Pain in thoracic spine: Secondary | ICD-10-CM | POA: Diagnosis not present

## 2018-04-30 DIAGNOSIS — J069 Acute upper respiratory infection, unspecified: Secondary | ICD-10-CM | POA: Diagnosis not present

## 2018-05-16 ENCOUNTER — Ambulatory Visit (INDEPENDENT_AMBULATORY_CARE_PROVIDER_SITE_OTHER): Payer: BLUE CROSS/BLUE SHIELD | Admitting: Family Medicine

## 2018-05-16 ENCOUNTER — Encounter: Payer: Self-pay | Admitting: Family Medicine

## 2018-05-16 VITALS — BP 111/72 | HR 77 | Resp 16 | Ht 64.0 in | Wt 163.0 lb

## 2018-05-16 DIAGNOSIS — R51 Headache: Secondary | ICD-10-CM

## 2018-05-16 DIAGNOSIS — G8929 Other chronic pain: Secondary | ICD-10-CM

## 2018-05-16 DIAGNOSIS — M542 Cervicalgia: Secondary | ICD-10-CM | POA: Diagnosis not present

## 2018-05-16 MED ORDER — TIZANIDINE HCL 2 MG PO CAPS: 2.0000 mg | ORAL_CAPSULE | Freq: Three times a day (TID) | ORAL | 0 refills | Status: DC | PRN

## 2018-05-16 MED ORDER — PREDNISONE 20 MG PO TABS: ORAL_TABLET | ORAL | 0 refills | Status: DC
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
## 2018-05-16 NOTE — Patient Instructions (Signed)
Avoid NSAIDS if possible to prevent rebound headache.  Start prednisone taper as directed and new muscle relaxer.   If you do not see improvement followup in 4 weeks.   Analgesic Rebound Headache An analgesic rebound headache, sometimes called a medication overuse headache, is a headache that comes after pain medicine (analgesic) taken to treat the original (primary) headache has worn off. Any type of primary headache can return as a rebound headache if a person regularly takes analgesics more than three times a week to treat it. The types of primary headaches that are commonly associated with rebound headaches include:  Migraines.  Headaches that arise from tense muscles in the head and neck area (tension headaches).  Headaches that develop and happen again (recur) on one side of the head and around the eye (cluster headaches).  If rebound headaches continue, they become chronic daily headaches. What are the causes? This condition may be caused by frequent use of:  Over-the-counter medicines such as aspirin, ibuprofen, and acetaminophen.  Sinus relief medicines and other medicines that contain caffeine.  Narcotic pain medicines such as codeine and oxycodone.  What are the signs or symptoms? The symptoms of a rebound headache are the same as the symptoms of the original headache. Some of the symptoms of specific types of headaches include: Migraine headache  Pulsing or throbbing pain on one or both sides of the head.  Severe pain that interferes with daily activities.  Pain that is worsened by physical activity.  Nausea, vomiting, or both.  Pain with exposure to bright light, loud noises, or strong smells.  General sensitivity to bright light, loud noises, or strong smells.  Visual changes.  Numbness of one or both arms. Tension headache  Pressure around the head.  Dull, aching head pain.  Pain felt over the front and sides of the head.  Tenderness in the muscles  of the head, neck, and shoulders. Cluster headache  Severe pain that begins in or around one eye or temple.  Redness and tearing in the eye on the same side as the pain.  Droopy or swollen eyelid.  One-sided head pain.  Nausea.  Runny nose.  Sweaty, pale facial skin.  Restlessness. How is this diagnosed? This condition is diagnosed by:  Reviewing your medical history. This includes the nature of your primary headaches.  Reviewing the types of pain medicines that you have been using to treat your headaches and how often you take them.  How is this treated? This condition may be treated or managed by:  Discontinuing frequent use of the analgesic medicine. Doing this may worsen your headaches at first, but the pain should eventually become more manageable, less frequent, and less severe.  Seeing a headache specialist. He or she may be able to help you manage your headaches and help make sure there is not another cause of the headaches.  Using methods of stress relief, such as acupuncture, counseling, biofeedback, and massage. Talk with your health care provider about which methods might be good for you.  Follow these instructions at home:  Take over-the-counter and prescription medicines only as told by your health care provider.  Stop the repeated use of pain medicine as told by your health care provider. Stopping can be difficult. Carefully follow instructions from your health care provider.  Avoid triggers that are known to cause your primary headaches.  Keep all follow-up visits as told by your health care provider. This is important. Contact a health care provider if:  You  continue to experience headaches after following treatments that your health care provider recommended. Get help right away if:  You develop new headache pain.  You develop headache pain that is different than what you have experienced in the past.  You develop numbness or tingling in your arms  or legs.  You develop changes in your speech or vision. This information is not intended to replace advice given to you by your health care provider. Make sure you discuss any questions you have with your health care provider. Document Released: 01/13/2004 Document Revised: 05/12/2016 Document Reviewed: 03/27/2016 Elsevier Interactive Patient Education  2018 Elsevier Inc.  Tension Headache A tension headache is pain, pressure, or aching that is felt over the front and sides of your head. These headaches can last from 30 minutes to several days. Follow these instructions at home: Managing pain  Take over-the-counter and prescription medicines only as told by your doctor.  Lie down in a dark, quiet room when you have a headache.  If directed, apply ice to your head and neck area: ? Put ice in a plastic bag. ? Place a towel between your skin and the bag. ? Leave the ice on for 20 minutes, 2-3 times per day.  Use a heating pad or a hot shower to apply heat to your head and neck area as told by your doctor. Eating and drinking  Eat meals on a regular schedule.  Do not drink a lot of alcohol.  Do not use a lot of caffeine, or stop using caffeine. General instructions  Keep all follow-up visits as told by your doctor. This is important.  Keep a journal to find out if certain things bring on headaches. For example, write down: ? What you eat and drink. ? How much sleep you get. ? Any change to your diet or medicines.  Try getting a massage, or doing other things that help you to relax.  Lessen stress.  Sit up straight. Do not tighten (tense) your muscles.  Do not use tobacco products. This includes cigarettes, chewing tobacco, or e-cigarettes. If you need help quitting, ask your doctor.  Exercise regularly as told by your doctor.  Get enough sleep. This may mean 7-9 hours of sleep. Contact a doctor if:  Your symptoms are not helped by medicine.  You have a headache that  feels different from your usual headache.  You feel sick to your stomach (nauseous) or you throw up (vomit).  You have a fever. Get help right away if:  Your headache becomes very bad.  You keep throwing up.  You have a stiff neck.  You have trouble seeing.  You have trouble speaking.  You have pain in your eye or ear.  Your muscles are weak or you lose muscle control.  You lose your balance or you have trouble walking.  You feel like you will pass out (faint) or you pass out.  You have confusion. This information is not intended to replace advice given to you by your health care provider. Make sure you discuss any questions you have with your health care provider. Document Released: 01/17/2010 Document Revised: 06/22/2016 Document Reviewed: 02/15/2015 Elsevier Interactive Patient Education  [employer]2018 [employer] Inc.

## 2018-05-29 DIAGNOSIS — F319 Bipolar disorder, unspecified: Secondary | ICD-10-CM | POA: Diagnosis not present

## 2018-06-05 DIAGNOSIS — M9902 Segmental and somatic dysfunction of thoracic region: Secondary | ICD-10-CM | POA: Diagnosis not present

## 2018-06-05 DIAGNOSIS — M542 Cervicalgia: Secondary | ICD-10-CM | POA: Diagnosis not present

## 2018-06-05 DIAGNOSIS — M9901 Segmental and somatic dysfunction of cervical region: Secondary | ICD-10-CM | POA: Diagnosis not present

## 2018-06-05 DIAGNOSIS — M546 Pain in thoracic spine: Secondary | ICD-10-CM | POA: Diagnosis not present

## 2018-06-10 DIAGNOSIS — Z01419 Encounter for gynecological examination (general) (routine) without abnormal findings: Secondary | ICD-10-CM | POA: Diagnosis not present

## 2018-06-10 DIAGNOSIS — Z1231 Encounter for screening mammogram for malignant neoplasm of breast: Secondary | ICD-10-CM | POA: Diagnosis not present

## 2018-06-10 DIAGNOSIS — Z803 Family history of malignant neoplasm of breast: Secondary | ICD-10-CM | POA: Diagnosis not present

## 2018-06-10 DIAGNOSIS — Z6827 Body mass index (BMI) 27.0-27.9, adult: Secondary | ICD-10-CM | POA: Diagnosis not present

## 2018-06-10 DIAGNOSIS — Z1239 Encounter for other screening for malignant neoplasm of breast: Secondary | ICD-10-CM | POA: Diagnosis not present

## 2018-06-14 ENCOUNTER — Other Ambulatory Visit: Payer: Self-pay | Admitting: Obstetrics and Gynecology

## 2018-06-14 DIAGNOSIS — M9902 Segmental and somatic dysfunction of thoracic region: Secondary | ICD-10-CM | POA: Diagnosis not present

## 2018-06-14 DIAGNOSIS — M9901 Segmental and somatic dysfunction of cervical region: Secondary | ICD-10-CM | POA: Diagnosis not present

## 2018-06-14 DIAGNOSIS — M546 Pain in thoracic spine: Secondary | ICD-10-CM | POA: Diagnosis not present

## 2018-06-14 DIAGNOSIS — M542 Cervicalgia: Secondary | ICD-10-CM | POA: Diagnosis not present

## 2018-06-14 DIAGNOSIS — Z803 Family history of malignant neoplasm of breast: Secondary | ICD-10-CM

## 2018-06-26 DIAGNOSIS — M546 Pain in thoracic spine: Secondary | ICD-10-CM | POA: Diagnosis not present

## 2018-06-26 DIAGNOSIS — M9901 Segmental and somatic dysfunction of cervical region: Secondary | ICD-10-CM | POA: Diagnosis not present

## 2018-06-26 DIAGNOSIS — M9902 Segmental and somatic dysfunction of thoracic region: Secondary | ICD-10-CM | POA: Diagnosis not present

## 2018-06-26 DIAGNOSIS — M542 Cervicalgia: Secondary | ICD-10-CM | POA: Diagnosis not present

## 2018-06-27 ENCOUNTER — Other Ambulatory Visit: Payer: Self-pay

## 2018-06-27 ENCOUNTER — Other Ambulatory Visit: Payer: Self-pay | Admitting: Family Medicine

## 2018-06-27 DIAGNOSIS — R51 Headache: Secondary | ICD-10-CM

## 2018-06-27 DIAGNOSIS — M542 Cervicalgia: Secondary | ICD-10-CM

## 2018-06-27 DIAGNOSIS — G8929 Other chronic pain: Secondary | ICD-10-CM

## 2018-06-27 MED ORDER — TIZANIDINE HCL 2 MG PO CAPS: 2.0000 mg | ORAL_CAPSULE | Freq: Three times a day (TID) | ORAL | 5 refills | Status: DC | PRN

## 2018-06-27 NOTE — Telephone Encounter (Signed)
Refills on muscle relaxer tizanidine called in  With additional refills on the bottle.

## 2018-07-03 ENCOUNTER — Ambulatory Visit
Admission: RE | Admit: 2018-07-03 | Discharge: 2018-07-03 | Disposition: A | Discharge status: Home / Self Care | Payer: BLUE CROSS/BLUE SHIELD | Source: Ambulatory Visit | Attending: Obstetrics and Gynecology | Admitting: Obstetrics and Gynecology

## 2018-07-03 DIAGNOSIS — Z803 Family history of malignant neoplasm of breast: Secondary | ICD-10-CM | POA: Diagnosis not present

## 2018-07-03 MED ORDER — GADOBENATE DIMEGLUMINE 529 MG/ML IV SOLN
15.0000 mL | Freq: Once | INTRAVENOUS | Status: AC | PRN
  Administered 2018-07-03: 15 mL via INTRAVENOUS

## 2018-07-05 DIAGNOSIS — M542 Cervicalgia: Secondary | ICD-10-CM | POA: Diagnosis not present

## 2018-07-05 DIAGNOSIS — M9902 Segmental and somatic dysfunction of thoracic region: Secondary | ICD-10-CM | POA: Diagnosis not present

## 2018-07-05 DIAGNOSIS — M546 Pain in thoracic spine: Secondary | ICD-10-CM | POA: Diagnosis not present

## 2018-07-05 DIAGNOSIS — M9901 Segmental and somatic dysfunction of cervical region: Secondary | ICD-10-CM | POA: Diagnosis not present

## 2018-07-09 DIAGNOSIS — S335XXA Sprain of ligaments of lumbar spine, initial encounter: Secondary | ICD-10-CM | POA: Diagnosis not present

## 2018-07-12 DIAGNOSIS — M542 Cervicalgia: Secondary | ICD-10-CM | POA: Diagnosis not present

## 2018-07-12 DIAGNOSIS — M9902 Segmental and somatic dysfunction of thoracic region: Secondary | ICD-10-CM | POA: Diagnosis not present

## 2018-07-12 DIAGNOSIS — M9901 Segmental and somatic dysfunction of cervical region: Secondary | ICD-10-CM | POA: Diagnosis not present

## 2018-07-12 DIAGNOSIS — M546 Pain in thoracic spine: Secondary | ICD-10-CM | POA: Diagnosis not present

## 2018-07-19 DIAGNOSIS — M546 Pain in thoracic spine: Secondary | ICD-10-CM | POA: Diagnosis not present

## 2018-07-19 DIAGNOSIS — M9901 Segmental and somatic dysfunction of cervical region: Secondary | ICD-10-CM | POA: Diagnosis not present

## 2018-07-19 DIAGNOSIS — M542 Cervicalgia: Secondary | ICD-10-CM | POA: Diagnosis not present

## 2018-07-19 DIAGNOSIS — M9902 Segmental and somatic dysfunction of thoracic region: Secondary | ICD-10-CM | POA: Diagnosis not present

## 2018-07-23 DIAGNOSIS — Z23 Encounter for immunization: Secondary | ICD-10-CM | POA: Diagnosis not present

## 2018-07-26 ENCOUNTER — Encounter: Payer: Self-pay | Admitting: Family Medicine

## 2018-07-26 ENCOUNTER — Ambulatory Visit (INDEPENDENT_AMBULATORY_CARE_PROVIDER_SITE_OTHER): Payer: BLUE CROSS/BLUE SHIELD | Admitting: Family Medicine

## 2018-07-26 VITALS — BP 108/73 | HR 77 | Temp 98.1°F | Resp 20 | Ht 64.0 in | Wt 163.0 lb

## 2018-07-26 DIAGNOSIS — H60501 Unspecified acute noninfective otitis externa, right ear: Secondary | ICD-10-CM | POA: Diagnosis not present

## 2018-07-26 MED ORDER — NEOMYCIN-POLYMYXIN-HC 3.5-10000-1 OT SOLN: 4.0000 [drp] | Freq: Four times a day (QID) | OTIC | 0 refills | Status: DC
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
## 2018-07-26 NOTE — Patient Instructions (Signed)
Start cortisporin 4 drops every 6 hours for 7 days. This may be an early infection of the ear canal. You do have redness in this area to the tympanic membrane (but not including the membrane)  Your exam otherwise is normal.

## 2018-07-30 DIAGNOSIS — F319 Bipolar disorder, unspecified: Secondary | ICD-10-CM | POA: Diagnosis not present

## 2018-08-05 DIAGNOSIS — M546 Pain in thoracic spine: Secondary | ICD-10-CM | POA: Diagnosis not present

## 2018-08-05 DIAGNOSIS — M9902 Segmental and somatic dysfunction of thoracic region: Secondary | ICD-10-CM | POA: Diagnosis not present

## 2018-08-05 DIAGNOSIS — M542 Cervicalgia: Secondary | ICD-10-CM | POA: Diagnosis not present

## 2018-08-05 DIAGNOSIS — M9901 Segmental and somatic dysfunction of cervical region: Secondary | ICD-10-CM | POA: Diagnosis not present

## 2018-08-14 DIAGNOSIS — F319 Bipolar disorder, unspecified: Secondary | ICD-10-CM | POA: Diagnosis not present

## 2018-08-16 DIAGNOSIS — M542 Cervicalgia: Secondary | ICD-10-CM | POA: Diagnosis not present

## 2018-08-16 DIAGNOSIS — M545 Low back pain: Secondary | ICD-10-CM | POA: Diagnosis not present

## 2018-08-19 DIAGNOSIS — M546 Pain in thoracic spine: Secondary | ICD-10-CM | POA: Diagnosis not present

## 2018-08-19 DIAGNOSIS — M9902 Segmental and somatic dysfunction of thoracic region: Secondary | ICD-10-CM | POA: Diagnosis not present

## 2018-08-19 DIAGNOSIS — M542 Cervicalgia: Secondary | ICD-10-CM | POA: Diagnosis not present

## 2018-08-19 DIAGNOSIS — M9901 Segmental and somatic dysfunction of cervical region: Secondary | ICD-10-CM | POA: Diagnosis not present

## 2018-09-02 DIAGNOSIS — M542 Cervicalgia: Secondary | ICD-10-CM | POA: Diagnosis not present

## 2018-09-02 DIAGNOSIS — M9902 Segmental and somatic dysfunction of thoracic region: Secondary | ICD-10-CM | POA: Diagnosis not present

## 2018-09-02 DIAGNOSIS — M9901 Segmental and somatic dysfunction of cervical region: Secondary | ICD-10-CM | POA: Diagnosis not present

## 2018-09-02 DIAGNOSIS — M546 Pain in thoracic spine: Secondary | ICD-10-CM | POA: Diagnosis not present

## 2018-09-16 DIAGNOSIS — M542 Cervicalgia: Secondary | ICD-10-CM | POA: Diagnosis not present

## 2018-09-16 DIAGNOSIS — M9901 Segmental and somatic dysfunction of cervical region: Secondary | ICD-10-CM | POA: Diagnosis not present

## 2018-09-16 DIAGNOSIS — M9902 Segmental and somatic dysfunction of thoracic region: Secondary | ICD-10-CM | POA: Diagnosis not present

## 2018-09-16 DIAGNOSIS — M546 Pain in thoracic spine: Secondary | ICD-10-CM | POA: Diagnosis not present

## 2018-09-23 DIAGNOSIS — M542 Cervicalgia: Secondary | ICD-10-CM | POA: Diagnosis not present

## 2018-09-23 DIAGNOSIS — M9901 Segmental and somatic dysfunction of cervical region: Secondary | ICD-10-CM | POA: Diagnosis not present

## 2018-09-23 DIAGNOSIS — M9902 Segmental and somatic dysfunction of thoracic region: Secondary | ICD-10-CM | POA: Diagnosis not present

## 2018-09-30 DIAGNOSIS — M542 Cervicalgia: Secondary | ICD-10-CM | POA: Diagnosis not present

## 2018-09-30 DIAGNOSIS — M9901 Segmental and somatic dysfunction of cervical region: Secondary | ICD-10-CM | POA: Diagnosis not present

## 2018-09-30 DIAGNOSIS — M9902 Segmental and somatic dysfunction of thoracic region: Secondary | ICD-10-CM | POA: Diagnosis not present

## 2018-10-07 DIAGNOSIS — M542 Cervicalgia: Secondary | ICD-10-CM | POA: Diagnosis not present

## 2018-10-07 DIAGNOSIS — M9901 Segmental and somatic dysfunction of cervical region: Secondary | ICD-10-CM | POA: Diagnosis not present

## 2018-10-07 DIAGNOSIS — M9902 Segmental and somatic dysfunction of thoracic region: Secondary | ICD-10-CM | POA: Diagnosis not present

## 2018-10-10 DIAGNOSIS — M5489 Other dorsalgia: Secondary | ICD-10-CM | POA: Diagnosis not present

## 2018-10-14 DIAGNOSIS — M542 Cervicalgia: Secondary | ICD-10-CM | POA: Diagnosis not present

## 2018-10-14 DIAGNOSIS — M9902 Segmental and somatic dysfunction of thoracic region: Secondary | ICD-10-CM | POA: Diagnosis not present

## 2018-10-14 DIAGNOSIS — M9901 Segmental and somatic dysfunction of cervical region: Secondary | ICD-10-CM | POA: Diagnosis not present

## 2018-10-18 ENCOUNTER — Ambulatory Visit (INDEPENDENT_AMBULATORY_CARE_PROVIDER_SITE_OTHER): Payer: BLUE CROSS/BLUE SHIELD | Admitting: Family Medicine

## 2018-10-18 ENCOUNTER — Encounter: Payer: Self-pay | Admitting: Family Medicine

## 2018-10-18 VITALS — BP 114/77 | HR 88 | Temp 98.2°F | Resp 16 | Ht 63.0 in | Wt 156.0 lb

## 2018-10-18 DIAGNOSIS — R202 Paresthesia of skin: Secondary | ICD-10-CM | POA: Diagnosis not present

## 2018-10-18 DIAGNOSIS — R200 Anesthesia of skin: Secondary | ICD-10-CM | POA: Diagnosis not present

## 2018-10-18 DIAGNOSIS — M542 Cervicalgia: Secondary | ICD-10-CM

## 2018-10-18 DIAGNOSIS — G8929 Other chronic pain: Secondary | ICD-10-CM

## 2018-10-18 DIAGNOSIS — M546 Pain in thoracic spine: Secondary | ICD-10-CM | POA: Diagnosis not present

## 2018-10-18 MED ORDER — DICLOFENAC SODIUM 75 MG PO TBEC: 75.0000 mg | DELAYED_RELEASE_TABLET | Freq: Two times a day (BID) | ORAL | 5 refills | Status: DC
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
## 2018-10-18 NOTE — Patient Instructions (Addendum)
Your ear is not infected. Take Flonase and a oral antihistamine daily.  I have referred you to PT and prescribed diclofenac- Take this med daily as prescribed.  If not improved or worsening with PT, then we would consider further images or specialist.       Please help [place] help you:  We are honored you have chosen [facility] for your Primary Care home. Below you will find basic instructions that you may need to access in the future. Please help [place] help you by reading the instructions, which cover many of the frequent questions we experience.   Prescription refills and request:  -In order to allow more efficient response time, please call your pharmacy for all refills. They will forward the request electronically to [place]. This allows for the quickest possible response. Request left on a nurse line can take longer to refill, since these are checked as time allows between office patients and other phone calls.  - refill request can take up to 3-5 working days to complete.  - If request is sent electronically and request is appropiate, it is usually completed in 1-2 business days.  - all patients will need to be seen routinely for all chronic medical conditions requiring prescription medications (see follow-up below). If you are overdue for follow up on your condition, you will be asked to make an appointment and we will call in enough medication to cover you until your appointment (up to 30 days).  - all controlled substances will require a face to face visit to request/refill.  - if you desire your prescriptions to go through a new pharmacy, and have an active script at original pharmacy, you will need to call your pharmacy and have scripts transferred to new pharmacy. This is completed between the pharmacy locations and not by your provider.    Results: If any images or labs were ordered, it can take up to 1 week to get results depending on the test ordered and the lab/facility running and  resulting the test. - Normal or stable results, which do not need further discussion, may be released to your mychart immediately with attached note to you. A call may not be generated for normal results. Please make certain to sign up for mychart. If you have questions on how to activate your mychart you can call the front office.  - If your results need further discussion, our office will attempt to contact you via phone, and if unable to reach you after 2 attempts, we will release your abnormal result to your mychart with instructions.  - All results will be automatically released in mychart after 1 week.  - Your provider will provide you with explanation and instruction on all relevant material in your results. Please keep in mind, results and labs may appear confusing or abnormal to the untrained eye, but it does not mean they are actually abnormal for you personally. If you have any questions about your results that are not covered, or you desire more detailed explanation than what was provided, you should make an appointment with your provider to do so.   Our office handles many outgoing and incoming calls daily. If we have not contacted you within 1 week about your results, please check your mychart to see if there is a message first and if not, then contact our office.  In helping with this matter, you help decrease call volume, and therefore allow [place] to be able to respond to patients needs more  efficiently.   Acute office visits (sick visit):  An acute visit is intended for a new problem and are scheduled in shorter time slots to allow schedule openings for patients with new problems. This is the appropriate visit to discuss a new problem. Problems will not be addressed by phone call or Echart message. Appointment is needed if requesting treatment. In order to provide you with excellent quality medical care with proper time for you to explain your problem, have an exam and receive treatment with  instructions, these appointments should be limited to one new problem per visit. If you experience a new problem, in which you desire to be addressed, please make an acute office visit, we save openings on the schedule to accommodate you. Please do not save your new problem for any other type of visit, let us take care of it properly and quickly for you.   Follow up visits:  Depending on your condition(s) your provider will need to see you routinely in order to provide you with quality care and prescribe medication(s). Most chronic conditions (Example: hypertension, Diabetes, depression/anxiety... etc), require visits a couple times a year. Your provider will instruct you on proper follow up for your personal medical conditions and history. Please make certain to make follow up appointments for your condition as instructed. Failing to do so could result in lapse in your medication treatment/refills. If you request a refill, and are overdue to be seen on a condition, we will always provide you with a 30 day script (once) to allow you time to schedule.    Medicare wellness (well visit): - we have a wonderful Nurse Selena Batten(Kim), that will meet with you and provide you will yearly medicare wellness visits. These visits should occur yearly (can not be scheduled less than 1 calendar year apart) and cover preventive health, immunizations, advance directives and screenings you are entitled to yearly through your medicare benefits. Do not miss out on your entitled benefits, this is when medicare will pay for these benefits to be ordered for you.  These are strongly encouraged by your provider and is the appropriate type of visit to make certain you are up to date with all preventive health benefits. If you have not had your medicare wellness exam in the last 12 months, please make certain to schedule one by calling the office and schedule your medicare wellness with Selena BattenKim as soon as possible.   Yearly physical (well visit):   - Adults are recommended to be seen yearly for physicals. Check with your insurance and date of your last physical, most insurances require one calendar year between physicals. Physicals include all preventive health topics, screenings, medical exam and labs that are appropriate for gender/age and history. You may have fasting labs needed at this visit. This is a well visit (not a sick visit), new problems should not be covered during this visit (see acute visit).  - Pediatric patients are seen more frequently when they are younger. Your provider will advise you on well child visit timing that is appropriate for your their age. - This is not a medicare wellness visit. Medicare wellness exams do not have an exam portion to the visit. Some medicare companies allow for a physical, some do not allow a yearly physical. If your medicare allows a yearly physical you can schedule the medicare wellness with our nurse Selena BattenKim and have your physical with your provider after, on the same day. Please check with insurance for your full benefits.   Late  Policy/No Shows:  - all new patients should arrive 15-30 minutes earlier than appointment to allow [place] time  to  obtain all personal demographics,  insurance information and for you to complete office paperwork. - All established patients should arrive 10-15 minutes earlier than appointment time to update all information and be checked in .  - In our best efforts to run on time, if you are late for your appointment you will be asked to either reschedule or if able, we will work you back into the schedule. There will be a wait time to work you back in the schedule,  depending on availability.  - If you are unable to make it to your appointment as scheduled, please call 24 hours ahead of time to allow [place] to fill the time slot with someone else who needs to be seen. If you do not cancel your appointment ahead of time, you may be charged a no show fee.

## 2018-10-23 DIAGNOSIS — F319 Bipolar disorder, unspecified: Secondary | ICD-10-CM | POA: Diagnosis not present

## 2018-10-28 DIAGNOSIS — M546 Pain in thoracic spine: Secondary | ICD-10-CM | POA: Diagnosis not present

## 2018-10-28 DIAGNOSIS — R200 Anesthesia of skin: Secondary | ICD-10-CM | POA: Diagnosis not present

## 2018-10-28 DIAGNOSIS — M542 Cervicalgia: Secondary | ICD-10-CM | POA: Diagnosis not present

## 2018-10-31 DIAGNOSIS — L821 Other seborrheic keratosis: Secondary | ICD-10-CM | POA: Diagnosis not present

## 2018-10-31 DIAGNOSIS — Z23 Encounter for immunization: Secondary | ICD-10-CM | POA: Diagnosis not present

## 2018-10-31 DIAGNOSIS — M546 Pain in thoracic spine: Secondary | ICD-10-CM | POA: Diagnosis not present

## 2018-10-31 DIAGNOSIS — L84 Corns and callosities: Secondary | ICD-10-CM | POA: Diagnosis not present

## 2018-10-31 DIAGNOSIS — D1801 Hemangioma of skin and subcutaneous tissue: Secondary | ICD-10-CM | POA: Diagnosis not present

## 2018-10-31 DIAGNOSIS — R200 Anesthesia of skin: Secondary | ICD-10-CM | POA: Diagnosis not present

## 2018-10-31 DIAGNOSIS — M542 Cervicalgia: Secondary | ICD-10-CM | POA: Diagnosis not present

## 2018-11-04 DIAGNOSIS — M546 Pain in thoracic spine: Secondary | ICD-10-CM | POA: Diagnosis not present

## 2018-11-04 DIAGNOSIS — M542 Cervicalgia: Secondary | ICD-10-CM | POA: Diagnosis not present

## 2018-11-04 DIAGNOSIS — R200 Anesthesia of skin: Secondary | ICD-10-CM | POA: Diagnosis not present

## 2018-11-07 DIAGNOSIS — M542 Cervicalgia: Secondary | ICD-10-CM | POA: Diagnosis not present

## 2018-11-07 DIAGNOSIS — M546 Pain in thoracic spine: Secondary | ICD-10-CM | POA: Diagnosis not present

## 2018-11-07 DIAGNOSIS — R200 Anesthesia of skin: Secondary | ICD-10-CM | POA: Diagnosis not present

## 2018-11-08 DIAGNOSIS — M9902 Segmental and somatic dysfunction of thoracic region: Secondary | ICD-10-CM | POA: Diagnosis not present

## 2018-11-08 DIAGNOSIS — M531 Cervicobrachial syndrome: Secondary | ICD-10-CM | POA: Diagnosis not present

## 2018-11-08 DIAGNOSIS — M9903 Segmental and somatic dysfunction of lumbar region: Secondary | ICD-10-CM | POA: Diagnosis not present

## 2018-11-08 DIAGNOSIS — M9901 Segmental and somatic dysfunction of cervical region: Secondary | ICD-10-CM | POA: Diagnosis not present

## 2018-11-11 DIAGNOSIS — M542 Cervicalgia: Secondary | ICD-10-CM | POA: Diagnosis not present

## 2018-11-11 DIAGNOSIS — R200 Anesthesia of skin: Secondary | ICD-10-CM | POA: Diagnosis not present

## 2018-11-11 DIAGNOSIS — M546 Pain in thoracic spine: Secondary | ICD-10-CM | POA: Diagnosis not present

## 2018-11-14 DIAGNOSIS — M546 Pain in thoracic spine: Secondary | ICD-10-CM | POA: Diagnosis not present

## 2018-11-14 DIAGNOSIS — M542 Cervicalgia: Secondary | ICD-10-CM | POA: Diagnosis not present

## 2018-11-14 DIAGNOSIS — R200 Anesthesia of skin: Secondary | ICD-10-CM | POA: Diagnosis not present

## 2018-11-18 DIAGNOSIS — M546 Pain in thoracic spine: Secondary | ICD-10-CM | POA: Diagnosis not present

## 2018-11-18 DIAGNOSIS — R200 Anesthesia of skin: Secondary | ICD-10-CM | POA: Diagnosis not present

## 2018-11-18 DIAGNOSIS — M542 Cervicalgia: Secondary | ICD-10-CM | POA: Diagnosis not present

## 2018-11-21 DIAGNOSIS — R200 Anesthesia of skin: Secondary | ICD-10-CM | POA: Diagnosis not present

## 2018-11-21 DIAGNOSIS — M542 Cervicalgia: Secondary | ICD-10-CM | POA: Diagnosis not present

## 2018-11-21 DIAGNOSIS — M546 Pain in thoracic spine: Secondary | ICD-10-CM | POA: Diagnosis not present

## 2018-11-24 ENCOUNTER — Other Ambulatory Visit: Payer: Self-pay | Admitting: Family Medicine

## 2018-11-24 DIAGNOSIS — E781 Pure hyperglyceridemia: Secondary | ICD-10-CM

## 2018-11-24 DIAGNOSIS — E785 Hyperlipidemia, unspecified: Secondary | ICD-10-CM

## 2018-11-25 ENCOUNTER — Encounter: Payer: Self-pay | Admitting: Family Medicine

## 2018-11-25 ENCOUNTER — Ambulatory Visit (INDEPENDENT_AMBULATORY_CARE_PROVIDER_SITE_OTHER): Payer: BLUE CROSS/BLUE SHIELD | Admitting: Family Medicine

## 2018-11-25 VITALS — BP 100/67 | HR 90 | Temp 98.0°F | Resp 16 | Ht 65.0 in | Wt 154.0 lb

## 2018-11-25 DIAGNOSIS — R7309 Other abnormal glucose: Secondary | ICD-10-CM

## 2018-11-25 DIAGNOSIS — R200 Anesthesia of skin: Secondary | ICD-10-CM | POA: Diagnosis not present

## 2018-11-25 DIAGNOSIS — Z1211 Encounter for screening for malignant neoplasm of colon: Secondary | ICD-10-CM

## 2018-11-25 DIAGNOSIS — M546 Pain in thoracic spine: Secondary | ICD-10-CM | POA: Diagnosis not present

## 2018-11-25 DIAGNOSIS — M542 Cervicalgia: Secondary | ICD-10-CM | POA: Diagnosis not present

## 2018-11-25 DIAGNOSIS — E785 Hyperlipidemia, unspecified: Secondary | ICD-10-CM

## 2018-11-25 DIAGNOSIS — Z79899 Other long term (current) drug therapy: Secondary | ICD-10-CM | POA: Diagnosis not present

## 2018-11-25 DIAGNOSIS — E663 Overweight: Secondary | ICD-10-CM

## 2018-11-25 DIAGNOSIS — Z0000 Encounter for general adult medical examination without abnormal findings: Secondary | ICD-10-CM | POA: Diagnosis not present

## 2018-11-25 LAB — LIPID PANEL
Cholesterol: 192 mg/dL (ref 0–200)
HDL: 57.2 mg/dL
LDL Cholesterol: 118 mg/dL — ABNORMAL HIGH (ref 0–99)
NonHDL: 134.85
Total CHOL/HDL Ratio: 3
Triglycerides: 86 mg/dL (ref 0.0–149.0)
VLDL: 17.2 mg/dL (ref 0.0–40.0)

## 2018-11-25 LAB — URINALYSIS, ROUTINE W REFLEX MICROSCOPIC
Bilirubin Urine: NEGATIVE
Hgb urine dipstick: NEGATIVE
Ketones, ur: NEGATIVE
Leukocytes, UA: NEGATIVE
NITRITE: NEGATIVE
RBC / HPF: NONE SEEN (ref 0–?)
SPECIFIC GRAVITY, URINE: 1.015 (ref 1.000–1.030)
Total Protein, Urine: NEGATIVE
Urine Glucose: NEGATIVE
Urobilinogen, UA: 0.2 (ref 0.0–1.0)
pH: 5.5 (ref 5.0–8.0)

## 2018-11-25 LAB — CBC WITH DIFFERENTIAL/PLATELET
Basophils Absolute: 0 K/uL (ref 0.0–0.1)
Basophils Relative: 0.7 % (ref 0.0–3.0)
Eosinophils Absolute: 0.1 K/uL (ref 0.0–0.7)
Eosinophils Relative: 1.2 % (ref 0.0–5.0)
HCT: 34 % — ABNORMAL LOW (ref 36.0–46.0)
Hemoglobin: 11.6 g/dL — ABNORMAL LOW (ref 12.0–15.0)
Lymphocytes Relative: 29.1 % (ref 12.0–46.0)
Lymphs Abs: 1.6 K/uL (ref 0.7–4.0)
MCHC: 34.1 g/dL (ref 30.0–36.0)
MCV: 92.2 fl (ref 78.0–100.0)
Monocytes Absolute: 0.6 K/uL (ref 0.1–1.0)
Monocytes Relative: 10.4 % (ref 3.0–12.0)
Neutro Abs: 3.2 K/uL (ref 1.4–7.7)
Neutrophils Relative %: 58.6 % (ref 43.0–77.0)
Platelets: 289 K/uL (ref 150.0–400.0)
RBC: 3.69 Mil/uL — ABNORMAL LOW (ref 3.87–5.11)
RDW: 12.2 % (ref 11.5–15.5)
WBC: 5.5 K/uL (ref 4.0–10.5)

## 2018-11-25 LAB — COMPREHENSIVE METABOLIC PANEL
ALT: 15 U/L (ref 0–35)
AST: 17 U/L (ref 0–37)
Albumin: 4.3 g/dL (ref 3.5–5.2)
Alkaline Phosphatase: 31 U/L — ABNORMAL LOW (ref 39–117)
BUN: 17 mg/dL (ref 6–23)
CO2: 27 meq/L (ref 19–32)
Calcium: 10 mg/dL (ref 8.4–10.5)
Chloride: 110 mEq/L (ref 96–112)
Creatinine, Ser: 1.1 mg/dL (ref 0.40–1.20)
GFR: 52.67 mL/min — ABNORMAL LOW (ref >60.00)
Glucose, Bld: 77 mg/dL (ref 70–99)
Potassium: 4.8 mEq/L (ref 3.5–5.1)
SODIUM: 145 meq/L (ref 135–145)
Total Bilirubin: 0.3 mg/dL (ref 0.2–1.2)
Total Protein: 6.5 g/dL (ref 6.0–8.3)

## 2018-11-25 LAB — HEMOGLOBIN A1C: Hgb A1c MFr Bld: 5.8 % (ref 4.6–6.5)

## 2018-11-25 LAB — TSH: TSH: 1.47 u[IU]/mL (ref 0.35–4.50)
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
## 2018-11-25 NOTE — Patient Instructions (Signed)

## 2018-11-26 ENCOUNTER — Other Ambulatory Visit: Payer: Self-pay | Admitting: Family Medicine

## 2018-11-26 ENCOUNTER — Telehealth: Payer: Self-pay | Admitting: Family Medicine

## 2018-11-26 ENCOUNTER — Other Ambulatory Visit: Payer: BLUE CROSS/BLUE SHIELD

## 2018-11-26 DIAGNOSIS — R944 Abnormal results of kidney function studies: Secondary | ICD-10-CM

## 2018-11-26 DIAGNOSIS — D649 Anemia, unspecified: Secondary | ICD-10-CM

## 2018-11-26 DIAGNOSIS — R8290 Unspecified abnormal findings in urine: Secondary | ICD-10-CM

## 2018-11-26 NOTE — Telephone Encounter (Signed)
Please inform patient the following information: Her labs are normal with the following exceptions-- - her a1c- diabetes screen-  is at the higher end of normal 5.8 (better than last year at 5.9), but her fasting glucose looks great. Decrease starchy carbohydrate/sugars in her diet and routine exercise will help her keep her a1c in normal range.  - her kidney function is mildly decreased from last check. Would recommend recheck for verification when she is NOT fasting and well hydrated. If still elevated then we would need to consider causes. - her urine did have more than normal bacteria. Since this was competed for her school requirements and not because she had symptoms---> I have sent it for urine culture and will wait for results to prove infection before electing to treat.  -Last  Years very mild anemia- is again present. I would recommend we check her iron panel to insure not iron deficiency cause.  - Her cholesterol looks good- I do recommend she stay on the fenofibrate her triglycerides are 86 and controlled on that medication  - lab appt only with in 2 weeks for retest on kidney function and check iron panel.  - For her form:    - She will need a PPD- Tb screen. Last year, she had her screen completed elsewhere (per review of our completed form for her last year). She will need to have it either completed and/or provide documentation to her school.

## 2018-11-26 NOTE — Telephone Encounter (Signed)
Pt. Given results and instructions. Verbalizes understanding. States she will call back to schedule lab work. States she does not need the form for school that she left.

## 2018-11-26 NOTE — Telephone Encounter (Signed)
LM for pt to [facility] to discuss lab results and recommendations. Okay for PEC to discuss labs and recommendations w pt.

## 2018-11-26 NOTE — Telephone Encounter (Unsigned)
Copied from CRM [id number]. Topic: Quick Communication - Lab Results (Clinic Use ONLY) >> Nov 26, 2018 10:25 AM Shelly Bombard, CMA wrote: LM for pt to [facility] regarding lab results. Okay for PEC to discuss lab and recommendations w pt. >> Nov 26, 2018 11:28 AM Marylen Ponto wrote: Pt called for lab results. Pt requests call back. Cb# [phone]

## 2018-11-27 LAB — URINE CULTURE
MICRO NUMBER:: 83108
RESULT: NO GROWTH
SPECIMEN QUALITY:: ADEQUATE

## 2018-11-29 DIAGNOSIS — M542 Cervicalgia: Secondary | ICD-10-CM | POA: Diagnosis not present

## 2018-11-29 DIAGNOSIS — M546 Pain in thoracic spine: Secondary | ICD-10-CM | POA: Diagnosis not present

## 2018-11-29 DIAGNOSIS — R200 Anesthesia of skin: Secondary | ICD-10-CM | POA: Diagnosis not present

## 2018-12-03 DIAGNOSIS — M542 Cervicalgia: Secondary | ICD-10-CM | POA: Diagnosis not present

## 2018-12-03 DIAGNOSIS — R200 Anesthesia of skin: Secondary | ICD-10-CM | POA: Diagnosis not present

## 2018-12-03 DIAGNOSIS — M546 Pain in thoracic spine: Secondary | ICD-10-CM | POA: Diagnosis not present

## 2018-12-04 DIAGNOSIS — M9902 Segmental and somatic dysfunction of thoracic region: Secondary | ICD-10-CM | POA: Diagnosis not present

## 2018-12-04 DIAGNOSIS — M9901 Segmental and somatic dysfunction of cervical region: Secondary | ICD-10-CM | POA: Diagnosis not present

## 2018-12-04 DIAGNOSIS — M9903 Segmental and somatic dysfunction of lumbar region: Secondary | ICD-10-CM | POA: Diagnosis not present

## 2018-12-04 DIAGNOSIS — M531 Cervicobrachial syndrome: Secondary | ICD-10-CM | POA: Diagnosis not present

## 2018-12-16 ENCOUNTER — Other Ambulatory Visit: Payer: Self-pay | Admitting: Family Medicine

## 2018-12-16 ENCOUNTER — Telehealth: Payer: Self-pay | Admitting: Family Medicine

## 2018-12-16 DIAGNOSIS — R51 Headache: Secondary | ICD-10-CM

## 2018-12-16 DIAGNOSIS — G8929 Other chronic pain: Secondary | ICD-10-CM

## 2018-12-16 DIAGNOSIS — M542 Cervicalgia: Secondary | ICD-10-CM

## 2018-12-16 MED ORDER — TIZANIDINE HCL 4 MG PO CAPS: 4.0000 mg | ORAL_CAPSULE | Freq: Two times a day (BID) | ORAL | 1 refills | Status: DC | PRN

## 2018-12-16 NOTE — Telephone Encounter (Signed)
Refilled her zanaflex- only one pill bid prn- the pill is now the higher dose of 4 mg.  Please make pt aware

## 2018-12-16 NOTE — Progress Notes (Signed)
Refilled her zanaflex- only one pill bid prn- the pill is now the higher dose of 4 mg.

## 2018-12-16 NOTE — Telephone Encounter (Signed)
Left detailed message on patients VM with medication, route, dose, and frequency. Per DPR, okay to leave message.

## 2018-12-30 ENCOUNTER — Ambulatory Visit (INDEPENDENT_AMBULATORY_CARE_PROVIDER_SITE_OTHER): Payer: BLUE CROSS/BLUE SHIELD | Admitting: Neurology

## 2018-12-30 ENCOUNTER — Other Ambulatory Visit: Payer: Self-pay

## 2018-12-30 ENCOUNTER — Encounter: Payer: Self-pay | Admitting: Neurology

## 2018-12-30 VITALS — BP 109/65 | HR 75 | Ht 64.0 in | Wt 146.5 lb

## 2018-12-30 DIAGNOSIS — M542 Cervicalgia: Secondary | ICD-10-CM | POA: Diagnosis not present

## 2018-12-30 DIAGNOSIS — G3281 Cerebellar ataxia in diseases classified elsewhere: Secondary | ICD-10-CM | POA: Diagnosis not present

## 2018-12-30 DIAGNOSIS — R269 Unspecified abnormalities of gait and mobility: Secondary | ICD-10-CM

## 2018-12-30 DIAGNOSIS — M62838 Other muscle spasm: Secondary | ICD-10-CM

## 2018-12-30 MED ORDER — TRAMADOL HCL 50 MG PO TABS: 50.0000 mg | ORAL_TABLET | Freq: Four times a day (QID) | ORAL | 1 refills | Status: DC | PRN

## 2018-12-30 MED ORDER — METHYLPREDNISOLONE 4 MG PO TABS: 4.0000 mg | ORAL_TABLET | Freq: Every day | ORAL | 0 refills | Status: DC
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
## 2018-12-31 ENCOUNTER — Other Ambulatory Visit: Payer: Self-pay | Admitting: Neurology

## 2018-12-31 ENCOUNTER — Telehealth: Payer: Self-pay | Admitting: Neurology

## 2018-12-31 DIAGNOSIS — M542 Cervicalgia: Secondary | ICD-10-CM

## 2018-12-31 DIAGNOSIS — R269 Unspecified abnormalities of gait and mobility: Secondary | ICD-10-CM

## 2018-12-31 NOTE — Telephone Encounter (Signed)
Dr. Epimenio Foot- do you want to reorder without contrast since pt has allergy?

## 2018-12-31 NOTE — Telephone Encounter (Signed)
Lowanda Foster, I placed the MRI orders without contrast

## 2018-12-31 NOTE — Telephone Encounter (Signed)
MR Brain w/wo contrast & MR Cervical spine w/wo contrast Dr. Gershon Mussel Auth: NPR Ref # [id number]. Patient is scheduled at [facility] for 01/01/19.  Patient also informed me she is allergic to contrast.

## 2019-01-01 ENCOUNTER — Ambulatory Visit: Payer: BLUE CROSS/BLUE SHIELD

## 2019-01-01 ENCOUNTER — Other Ambulatory Visit: Payer: Self-pay | Admitting: Neurology

## 2019-01-01 DIAGNOSIS — M542 Cervicalgia: Secondary | ICD-10-CM

## 2019-01-01 DIAGNOSIS — R269 Unspecified abnormalities of gait and mobility: Secondary | ICD-10-CM

## 2019-01-01 MED ORDER — GADOBENATE DIMEGLUMINE 529 MG/ML IV SOLN
15.0000 mL | Freq: Once | INTRAVENOUS | Status: AC | PRN
  Administered 2019-01-01: 15 mL via INTRAVENOUS

## 2019-01-01 NOTE — Telephone Encounter (Signed)
Noted I switched the orders.

## 2019-01-02 ENCOUNTER — Telehealth: Payer: Self-pay | Admitting: Neurology

## 2019-01-02 NOTE — Telephone Encounter (Signed)
I tried to call Alison Bradley to go over the results of the MRIs.  I got voicemail but the mailbox was full so could not leave a message.    The MRI of the brain was normal.  The cervical spine showed mild degenerative changes but nothing involving the spinal cord.  I will try to find out if she improved any with the steroid.    I will try to call her again tomorrow.

## 2019-01-03 NOTE — Telephone Encounter (Signed)
I tried to call Mrs. Bigos's mobile again to go over the results of the MRIs.  I got voicemail but the mailbox was full so could not leave a message.  I reached hoime voicemail and left a message that I called to go over MRI.    The MRI of the brain was normal.  The cervical spine showsd mild degenerative changes and possibly one small spot in the spinal cord (more likely artifact and could not explain all her symptoms).     I will try to call her again Monday

## 2019-01-05 DIAGNOSIS — G5620 Lesion of ulnar nerve, unspecified upper limb: Secondary | ICD-10-CM

## 2019-01-05 DIAGNOSIS — G5610 Other lesions of median nerve, unspecified upper limb: Secondary | ICD-10-CM

## 2019-01-05 HISTORY — DX: Lesion of ulnar nerve, unspecified upper limb: G56.20

## 2019-01-05 HISTORY — DX: Other lesions of median nerve, unspecified upper limb: G56.10

## 2019-01-06 NOTE — Telephone Encounter (Signed)
I spoke with Alison Bradley about the MRI results and how she is doing with her pain and other symptoms.  She feels that she is 90% better since starting the steroid and Xanax.  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  The MRI of the cervical spine showed some mild multilevel degenerative changes but no nerve root compression or spinal stenosis.   On some of the images there was a subtle focus in the spinal cord adjacent to C5-C6 to the right but this could be artifactual in nature.   Kara Mead, She will need a follow-up in about 2 weeks.

## 2019-01-08 ENCOUNTER — Telehealth: Payer: Self-pay | Admitting: Neurology

## 2019-01-08 NOTE — Telephone Encounter (Signed)
Pt has called for a refill on her traMADol (ULTRAM) 50 MG tablet and methylPREDNISolone (MEDROL) 4 MG tablet [facility] [phone]

## 2019-01-08 NOTE — Telephone Encounter (Signed)
I checked drug registry. She last refilled tramadol 12/30/18 #30. Too soon for refill.I called pt back to further discuss. She has some tramadol left and states she does not need refill. I advised she should take as needed and must last 30 days for rx. She is aware.   She would like another medrol dose pack. Has tried diclofenac, xanax but both ineffective.   . Reports more muscle aches, back and hip pain, dizziness, headache.She has had two bowls raisin bran, orange juice, soup, girl scout cookes and tea today.   I also made f/u for her to see Dr. Epimenio Foot on 01/21/19 at 1:30pm per Dr. Epimenio Foot note to have her f/u in 2 weeks.  Advised I will ask Dr. Epimenio Foot what he recommends for pain and call back.

## 2019-01-08 NOTE — Telephone Encounter (Signed)
Dr. Epimenio Foot- is there anything you would recommend? See note

## 2019-01-08 NOTE — Telephone Encounter (Signed)
We can do another steroid dosepack

## 2019-01-09 MED ORDER — METHYLPREDNISOLONE 4 MG PO TBPK: ORAL_TABLET | ORAL | 0 refills | Status: DC

## 2019-01-09 NOTE — Addendum Note (Signed)
Addended by: Hillis Range on: 01/09/2019 08:16 AM   Modules accepted: Orders

## 2019-01-09 NOTE — Telephone Encounter (Signed)
Called pt. Relayed Dr. Bonnita Hollow recommendation. She is agreeable to this. I escribed steroid dosepack.

## 2019-01-21 ENCOUNTER — Encounter: Payer: Self-pay | Admitting: Neurology

## 2019-01-21 ENCOUNTER — Ambulatory Visit (INDEPENDENT_AMBULATORY_CARE_PROVIDER_SITE_OTHER): Payer: BLUE CROSS/BLUE SHIELD | Admitting: Neurology

## 2019-01-21 ENCOUNTER — Other Ambulatory Visit: Payer: Self-pay

## 2019-01-21 ENCOUNTER — Encounter (INDEPENDENT_AMBULATORY_CARE_PROVIDER_SITE_OTHER): Payer: BLUE CROSS/BLUE SHIELD | Admitting: Neurology

## 2019-01-21 VITALS — BP 105/69 | HR 85 | Ht 64.0 in | Wt 143.0 lb

## 2019-01-21 DIAGNOSIS — G5601 Carpal tunnel syndrome, right upper limb: Secondary | ICD-10-CM

## 2019-01-21 DIAGNOSIS — M542 Cervicalgia: Secondary | ICD-10-CM

## 2019-01-21 DIAGNOSIS — G5621 Lesion of ulnar nerve, right upper limb: Secondary | ICD-10-CM | POA: Diagnosis not present

## 2019-01-21 DIAGNOSIS — G5600 Carpal tunnel syndrome, unspecified upper limb: Secondary | ICD-10-CM | POA: Insufficient documentation

## 2019-01-21 DIAGNOSIS — M62838 Other muscle spasm: Secondary | ICD-10-CM | POA: Diagnosis not present

## 2019-01-21 DIAGNOSIS — G5620 Lesion of ulnar nerve, unspecified upper limb: Secondary | ICD-10-CM | POA: Insufficient documentation

## 2019-01-21 DIAGNOSIS — M5412 Radiculopathy, cervical region: Secondary | ICD-10-CM | POA: Insufficient documentation

## 2019-01-21 DIAGNOSIS — Z0289 Encounter for other administrative examinations: Secondary | ICD-10-CM

## 2019-01-21 MED ORDER — TRAMADOL HCL (ER BIPHASIC) 150 MG PO CP24: ORAL_CAPSULE | ORAL | 5 refills | Status: DC

## 2019-01-21 MED ORDER — GABAPENTIN 300 MG PO CAPS: 300.0000 mg | ORAL_CAPSULE | Freq: Three times a day (TID) | ORAL | 11 refills | Status: DC

## 2019-01-21 NOTE — Progress Notes (Signed)
Full Name: Alison Bradley Gender: Female MRN #: [id number] Date of Birth: 1971/10/26    Visit Date: 01/21/2019 14:33 Age: 48 Years 8 Months Old Examining Physician: Despina Arias, MD  Referring Physician: Despina Arias, MD    History: Alison Bradley is a 48 year old woman with neck pain, shoulder pain, shoulder spasticity and numbness in the arms, right greater than left.  Nerve conduction studies: The right median motor response was normal.  The right ulnar motor response had a mildly reduced amplitude with distal stimulation and moderately reduced amplitude with proximal stimulation.  There was slowing across the ulnar groove.  The right radial sensory response was normal.  The right median sensory response was slowed across the wrist with normal amplitude.  The right ulnar sensory response was absent.  The ulnar F-wave responses were mildly delayed.  EMG: Needle EMG of selected muscles of the right arm was performed.  There was mild chronic denervation in some of the C7 innervated muscles.  Additionally, denervation was noted in the ulnar innervated first dorsal interosseous muscle.  There was no acute denervation in any of the muscles tested.  There was no abnormal spontaneous activity.   Impression: This NCV/EMG study shows the following: 1.    Chronic right C7 radiculopathy.  There were no active features. 2.    Moderately severe ulnar neuropathy at the right ulnar groove. 3.    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 Epimenio Foot, MD, PhD, FAAN Certified in Neurology, Clinical Neurophysiology, Sleep Medicine, Pain Medicine and Neuroimaging Director, [facility] at [facility]  [facility] [address], [address] [place], [place] [address] [phone]       [facility]    Nerve / Sites Muscle Latency Ref. Amplitude Ref. Rel Amp Segments Distance Velocity Ref. Area    ms ms mV mV %  cm m/s m/s mVms  R Median - APB     Wrist  APB 4.3 ?4.4 6.8 ?4.0 100 Wrist - APB 7   20.3     Upper arm APB 8.7  6.9  102 Upper arm - Wrist 22 50 ?49 20.6  R Ulnar - ADM     Wrist ADM 3.0 ?3.3 5.7 ?6.0 100 Wrist - ADM 7   26.2     B.Elbow ADM 6.7  2.6  45.4 B.Elbow - Wrist 20 53 ?49 9.2     A.Elbow ADM 9.6  2.5  97.4 A.Elbow - B.Elbow 10 35 ?49 5.1         A.Elbow - Wrist             SNC    Nerve / Sites Rec. Site Peak Lat Ref.  Amp Ref. Segments Distance    ms ms V V  cm  R Radial - Anatomical snuff box (Forearm)     Forearm Wrist 2.9 ?2.9 23 ?15 Forearm - Wrist 10  R Median - Orthodromic (Dig II, Mid palm)     Dig II Wrist 3.9 ?3.4 15 ?10 Dig II - Wrist 13  R Ulnar - Orthodromic, (Dig V, Mid palm)     Dig V Wrist NR ?3.1 NR ?5 Dig V - Wrist 10           F  Wave    Nerve F Lat Ref.   ms ms  R Ulnar - ADM 33.1 ?32.0       EMG full       EMG Summary  Table    Spontaneous MUAP Recruitment  Muscle IA Fib PSW Fasc Other Amp Dur. Poly Pattern  R. Biceps brachii Normal None None None _______ Normal Normal Normal Normal  R. Deltoid Normal None None None _______ Normal Normal Normal Normal  R. Triceps brachii Normal None None None _______ Normal Normal Normal Normal  R. Extensor digitorum communis Normal None None None _______ Normal Increased 1+ Reduced  R. Pronator teres Normal None None None _______ Normal Normal Normal Normal  R. Flexor carpi ulnaris Normal None None None _______ Normal Increased 1+ Reduced  R. First dorsal interosseous Normal None None None _______ Increased Increased 2+ Reduced  R. Abductor pollicis brevis Normal None None None _______ Normal Normal 1+ Normal  R. Supraspinatus Normal None None None _______ Normal Normal Normal Normal
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
## 2019-01-22 ENCOUNTER — Telehealth: Payer: Self-pay | Admitting: *Deleted

## 2019-01-22 ENCOUNTER — Encounter: Payer: Self-pay | Admitting: Family Medicine

## 2019-01-22 DIAGNOSIS — F319 Bipolar disorder, unspecified: Secondary | ICD-10-CM | POA: Diagnosis not present

## 2019-01-22 NOTE — Telephone Encounter (Signed)
Tramadol ER approved through 01/22/2020.  Case completed through covermymeds (key: [id number]).  Patient has coverage with [employer] [phone]).  ER#[id number].  BIN: [id number], PCN: [id number], Group: [id number] APPO.  CH#[id number].

## 2019-01-23 ENCOUNTER — Other Ambulatory Visit: Payer: Self-pay | Admitting: Neurology

## 2019-01-23 NOTE — Telephone Encounter (Signed)
Called, LVM for pt to let her know that tramadol ER on back order at pharmacy. Advised I spoke with Dr. Epimenio Foot who instructed her to take tramadol 50mg  tablet (IR) up to 4 times daily prn. If she does not feel any better by Monday to call back and Dr. Epimenio Foot can order ESI as discussed. Gave [facility] phone number.

## 2019-01-23 NOTE — Telephone Encounter (Signed)
I called [facility] since we received message that rx on back order. Verified it was. They checked and [facility] a [facility]. Advised I will discuss with Dr. Epimenio Foot

## 2019-01-28 ENCOUNTER — Ambulatory Visit: Payer: BLUE CROSS/BLUE SHIELD | Admitting: Family Medicine

## 2019-01-28 ENCOUNTER — Telehealth: Payer: Self-pay

## 2019-01-28 NOTE — Telephone Encounter (Signed)
Called and spoke with pt about morning appt for today that is scheduled. Pt stated she is being tx by a neurologist for her symptoms of weakness, fatigue, numbness, and tingling feeling she has all over her body. Pt is currently on medications and has had MRI completed. Pt states her sister got dx with Lupus and she would like to be tested for that and autoimmune disorders. Pt agreed to wait to come into office given the COVID 19 and was educated when to go to ED or to call Neuro who is currently treating her, she verbalized understanding and will call back to reschedule

## 2019-01-31 ENCOUNTER — Telehealth: Payer: Self-pay | Admitting: Neurology

## 2019-01-31 MED ORDER — TRAMADOL HCL 50 MG PO TABS: 50.0000 mg | ORAL_TABLET | Freq: Three times a day (TID) | ORAL | 5 refills | Status: DC | PRN

## 2019-01-31 NOTE — Telephone Encounter (Signed)
I left the patient a detailed message with the information below (ok per DPR).  Provided our number to call back with any questions.

## 2019-01-31 NOTE — Telephone Encounter (Signed)
Patient called in and stated that   gabapentin (NEURONTIN) 300 MG capsule  Is working and she wants to stay on it , she said the TraMADol HCl 150 MG CP24 insurance will not cover she wants to know if she can go back to traMADol (ULTRAM) 50 MG tablet

## 2019-01-31 NOTE — Telephone Encounter (Signed)
Per vo by Dr. Epimenio Foot, he will approve for the patient to be on immediate release Tramadol 50mg , one tablet TID PRN.  #90 x 5.

## 2019-02-03 ENCOUNTER — Telehealth: Payer: Self-pay | Admitting: Neurology

## 2019-02-03 MED ORDER — GABAPENTIN 300 MG PO CAPS: 900.0000 mg | ORAL_CAPSULE | Freq: Three times a day (TID) | ORAL | 11 refills | Status: DC

## 2019-02-03 NOTE — Telephone Encounter (Signed)
Called and spoke with pt. Relayed that Dr. Epimenio Foot called in new rx for gabapentin 900mg  TID. She verbalized understanding. She was at the pharmacy now

## 2019-02-03 NOTE — Telephone Encounter (Signed)
Patient called in requesting an increase in her   gabapentin (NEURONTIN) 300 MG capsule    she stated she never received an updated script at her pharmacy she is suppose to be on 900mg  advised patient the script that is sent equals 900mg  due to her having to take 300mg  3 times a day she then stated that she needs 900mg  3 times a day because 300mg  three times a day isnt working and it seems to be some miscommunication.

## 2019-03-03 ENCOUNTER — Telehealth: Payer: Self-pay | Admitting: *Deleted

## 2019-03-03 NOTE — Telephone Encounter (Signed)
Initiated PA gabapentin 300mg  (3 caps TID) on CMM. [id number]. Waiting for documents to be attached and then will submit.

## 2019-03-04 NOTE — Telephone Encounter (Signed)
CMM did not attach documents with first request, I had to resend request. They attached. PA submitted. Waiting on determination.

## 2019-03-05 NOTE — Telephone Encounter (Signed)
[id number];AOZHYQ:[id number];Review Type:Qty;Coverage Start Date:02/02/2019;Coverage End Date:03/03/2020/ Fax[facility].

## 2019-03-12 DIAGNOSIS — M9902 Segmental and somatic dysfunction of thoracic region: Secondary | ICD-10-CM | POA: Diagnosis not present

## 2019-03-12 DIAGNOSIS — M531 Cervicobrachial syndrome: Secondary | ICD-10-CM | POA: Diagnosis not present

## 2019-03-12 DIAGNOSIS — M9903 Segmental and somatic dysfunction of lumbar region: Secondary | ICD-10-CM | POA: Diagnosis not present

## 2019-03-12 DIAGNOSIS — M9901 Segmental and somatic dysfunction of cervical region: Secondary | ICD-10-CM | POA: Diagnosis not present

## 2019-03-13 DIAGNOSIS — M5022 Other cervical disc displacement, mid-cervical region, unspecified level: Secondary | ICD-10-CM | POA: Diagnosis not present

## 2019-03-13 DIAGNOSIS — M9903 Segmental and somatic dysfunction of lumbar region: Secondary | ICD-10-CM | POA: Diagnosis not present

## 2019-03-13 DIAGNOSIS — M542 Cervicalgia: Secondary | ICD-10-CM | POA: Diagnosis not present

## 2019-03-13 DIAGNOSIS — M545 Low back pain: Secondary | ICD-10-CM | POA: Diagnosis not present

## 2019-03-18 IMAGING — DX DG CERVICAL SPINE COMPLETE 4+V
6 series · 6 of 6 positions shown · non-contrast
Comparison: None.

CLINICAL DATA: Neck pain after fall 1 month ago.

EXAM:
CERVICAL SPINE - COMPLETE 4+ VIEW

[c-spine lat]
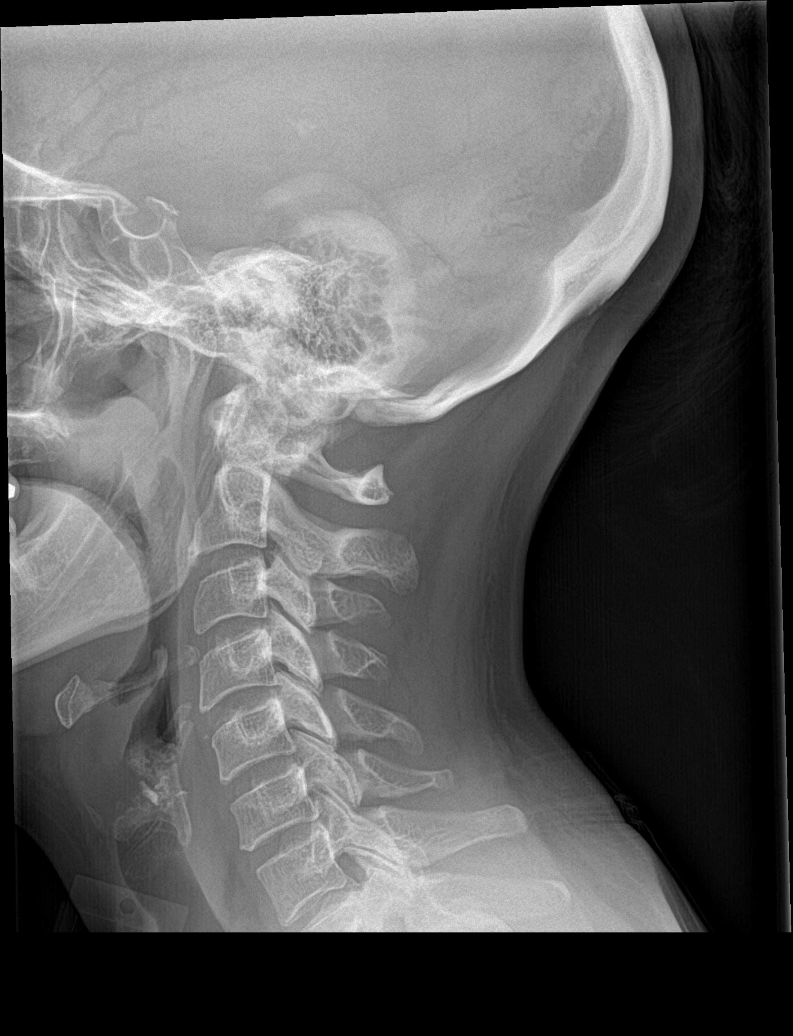

[c-spine obl (1 of 2)]
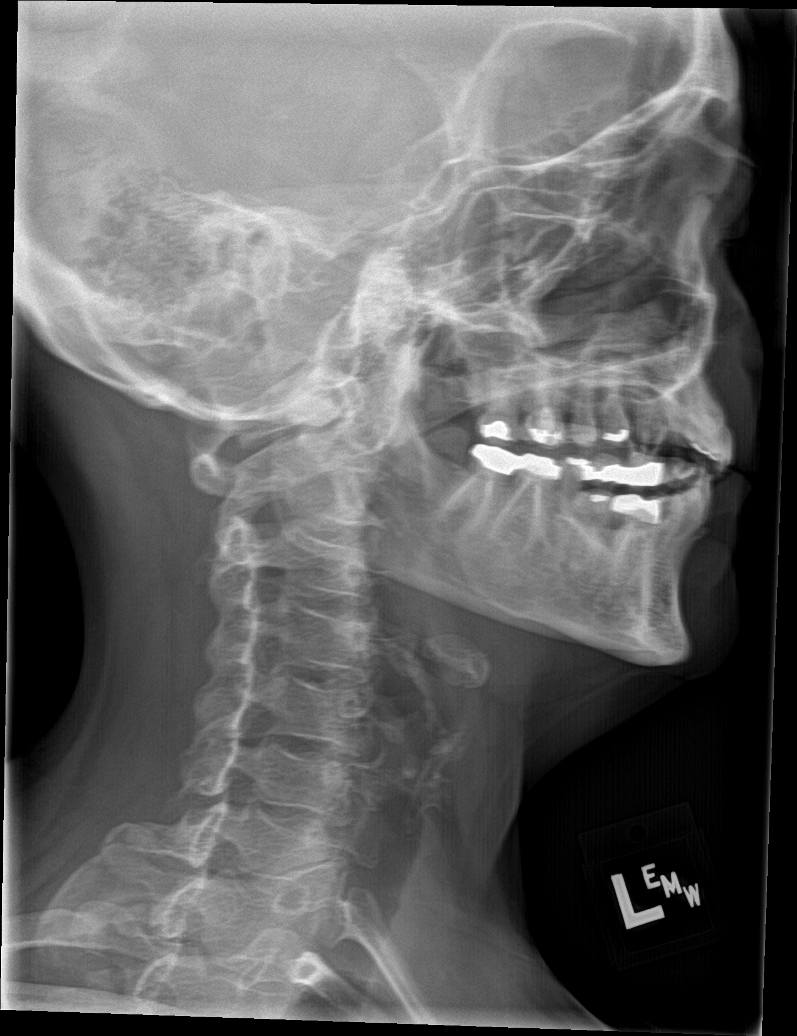

[c-spine obl (2 of 2)]
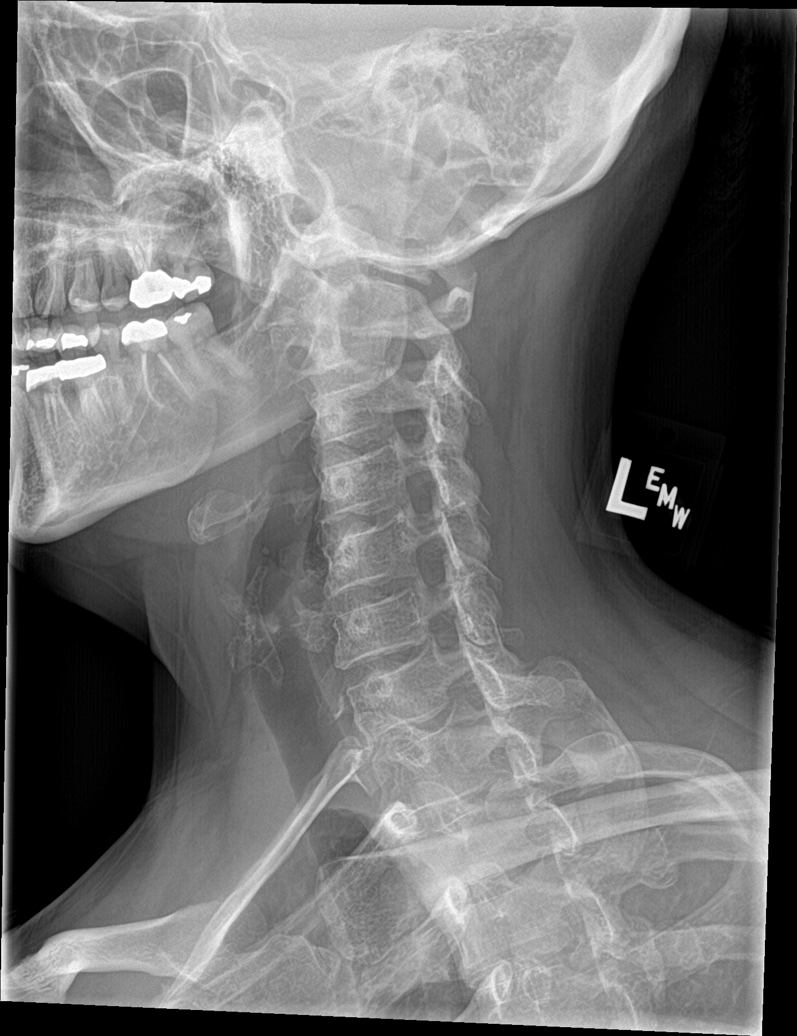

[c-spine ap]
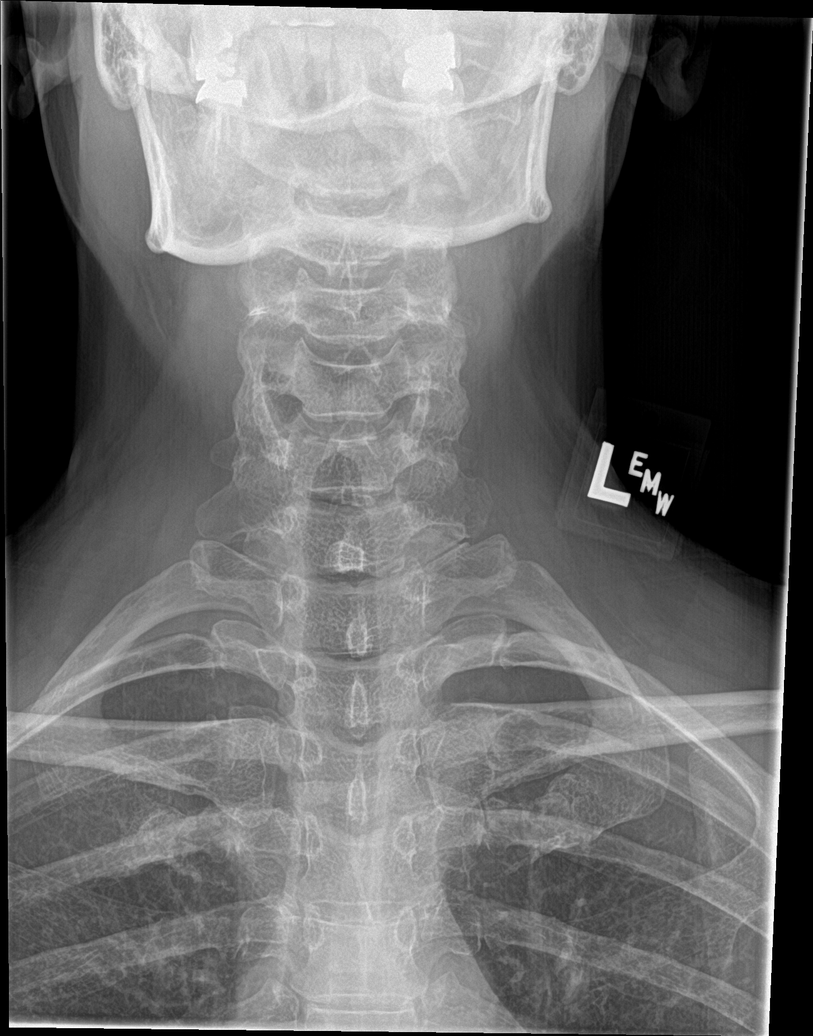

[c-spine open mouth]
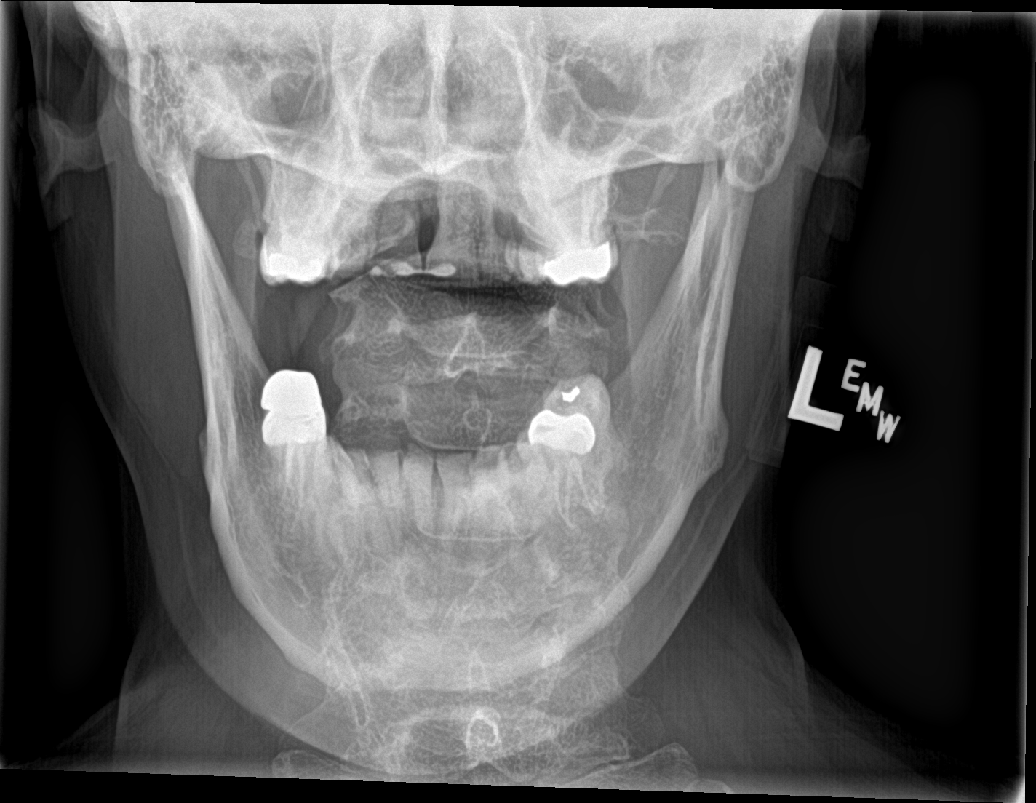

[[person_name]]
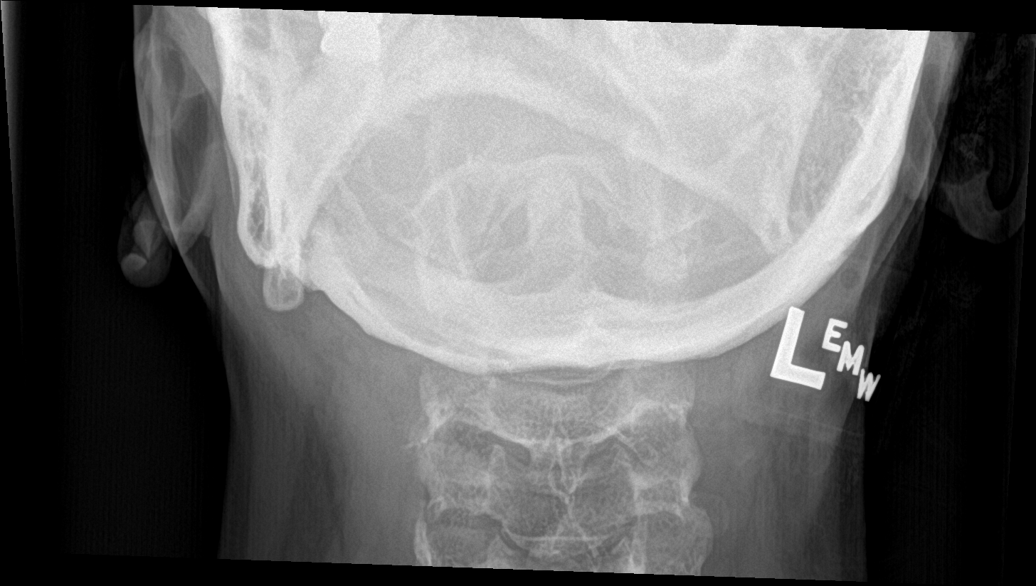

[6 of 6 positions shown; findings below may reference images not displayed]

FINDINGS: There is no evidence of cervical spine fracture or prevertebral soft
tissue swelling. Alignment is normal. No other significant bone
abnormalities are identified.
IMPRESSION: Negative cervical spine radiographs.

## 2019-03-21 ENCOUNTER — Telehealth: Payer: Self-pay | Admitting: Family Medicine

## 2019-03-21 NOTE — Telephone Encounter (Signed)
Pt was called and feels she has gotten no answers from Neuro as far as how she is feeling. She states the Gabapentin helps with the neck pain, but it is starting to hurt again, she has muscle pain and fatigue. She would like autoimmune testing due to get sister just getting dx with Lupus. Pt was encouraged to call her Neurologist back as well to let them know she was still experiencing neck pain and she did not "want to be on Gabapentin forever for the pain". Pt verbalized understanding and virtual visit was made.

## 2019-03-21 NOTE — Telephone Encounter (Signed)
Patient is still having musculoskeletal issues that no one can figure out. Patient is asking if she should schedule a virtual visit or should she schedule an in office visit. Patient hesitated to schedule a virtual visit since she was afraid she would be asked to come in to the office and she doesn't want to pay for two visits. Please advise what would be the best thing to do.

## 2019-03-26 ENCOUNTER — Encounter

## 2019-03-26 ENCOUNTER — Ambulatory Visit: Payer: BLUE CROSS/BLUE SHIELD | Admitting: Neurology

## 2019-03-26 ENCOUNTER — Other Ambulatory Visit: Payer: Self-pay

## 2019-03-26 ENCOUNTER — Ambulatory Visit (INDEPENDENT_AMBULATORY_CARE_PROVIDER_SITE_OTHER): Payer: BLUE CROSS/BLUE SHIELD | Admitting: Family Medicine

## 2019-03-26 ENCOUNTER — Encounter: Payer: Self-pay | Admitting: Family Medicine

## 2019-03-26 ENCOUNTER — Telehealth: Payer: Self-pay | Admitting: Family Medicine

## 2019-03-26 VITALS — Ht 64.0 in | Wt 146.2 lb

## 2019-03-26 DIAGNOSIS — Z8269 Family history of other diseases of the musculoskeletal system and connective tissue: Secondary | ICD-10-CM | POA: Diagnosis not present

## 2019-03-26 DIAGNOSIS — M62838 Other muscle spasm: Secondary | ICD-10-CM

## 2019-03-26 DIAGNOSIS — K529 Noninfective gastroenteritis and colitis, unspecified: Secondary | ICD-10-CM | POA: Insufficient documentation

## 2019-03-26 DIAGNOSIS — R5383 Other fatigue: Secondary | ICD-10-CM

## 2019-03-26 DIAGNOSIS — M791 Myalgia, unspecified site: Secondary | ICD-10-CM

## 2019-03-26 NOTE — Telephone Encounter (Signed)
Pt was called and given information. She will stop medication for 4 weeks and verbalized understanding

## 2019-03-26 NOTE — Telephone Encounter (Signed)
Please call pt and ask her if she is still taking the fenofibrate? If she is still taking it  Tell her to stop for 4 weeks and see if her myalgias improve. Some people can have myalgias and even spasms on this medication. It would not expalin her other symptoms though. It is not very common, but worth holding med for 4 weeks and seeing if it improves her symptoms.  Thanks.

## 2019-03-26 NOTE — Telephone Encounter (Signed)
Pt was called and message was left to return call  

## 2019-03-26 NOTE — Progress Notes (Signed)
VIRTUAL VISIT VIA VIDEO  I connected with [name] on 03/26/19 at  9:20 AM EDT by a video enabled telemedicine application and verified that I am speaking with the correct person using two identifiers. Location patient: Home Location provider: [facility] [place], Office Persons participating in the virtual visit: Patient, Dr. Raoul Pitch and R.Baker, LPN  I discussed the limitations of evaluation and management by telemedicine and the availability of in person appointments. The patient expressed understanding and agreed to proceed.   SUBJECTIVE Chief Complaint  Patient presents with  . Muscle Pain    Pt states sister was dx with Lupus and she thinks she has a autoimmune disorder. All symptoms have been going on x1-2 years. Pt sees Neurologist, F/U appt in Sept.   . Fatigue  . Diarrhea    HPI: ELOINA ERGLE is a 48 y.o. female present to discuss her worsening fatigue and myalgia.  She reports she has had fatigue, myalgias and muscle spasms for 1 to 2 years, however it is worsening.  She has seen a neurologist.  She reports she just found out her sister now has lupus.  She endorses dry skin, dry eyes, mildly dry mouth, fatigue, chronic diarrhea, muscle spasms, myalgias.  She denies any fever, unintentional weight loss, night sweats or rash. SHe has a history of mild anemia.  She is prescribed fenofibrate since 2018.  Her A1c and TSH are within normal limits 11/25/2018. ROS: See pertinent positives and negatives per HPI.  Patient Active Problem List   Diagnosis Date Noted  . Ulnar neuropathy 01/21/2019  . Carpal tunnel syndrome 01/21/2019  . Cervical radiculopathy 01/21/2019  . Muscle spasm 12/30/2018  . Gait disturbance 12/30/2018  . Overweight (BMI 25.0-29.9) 11/25/2018  . Elevated hemoglobin A1c 11/21/2017  . Hypertriglyceridemia 09/12/2017  . Anxiety 06/15/2017  . Neck pain 10/11/2011  . Hyperlipidemia LDL goal <130 10/11/2011  . Premature menopause 10/11/2011    Social  History   Tobacco Use  . Smoking status: Never Smoker  . Smokeless tobacco: Never Used  Substance Use Topics  . Alcohol use: No    Current Outpatient Medications:  .  Acetaminophen (TYLENOL 8 HOUR PO), Take 1 g/day by mouth daily. 1gm, 2-3x daily for headache and pain, Disp: , Rfl:  .  ALPRAZolam (XANAX) 1 MG tablet, Take 1 mg by mouth as needed for anxiety., Disp: , Rfl:  .  Calcium Polycarbophil (FIBER-CAPS PO), Take by mouth. 1 tablet qd, Disp: , Rfl:  .  Cyanocobalamin (VITAMIN B-12) 2500 MCG SUBL, Place 1 tablet under the tongue daily., Disp: , Rfl:  .  diclofenac (VOLTAREN) 75 MG EC tablet, Take 1 tablet (75 mg total) by mouth 2 (two) times daily., Disp: 60 tablet, Rfl: 5 .  estradiol (ESTRACE) 1 MG tablet, Take 1 tablet by mouth daily., Disp: , Rfl:  .  fenofibrate (TRICOR) 145 MG tablet, TAKE 1 TABLET BY MOUTH EVERY DAY, Disp: 30 tablet, Rfl: 11 .  Ferrous Sulfate (IRON) 325 (65 Fe) MG TABS, Take 1 tablet by mouth daily., Disp: , Rfl:  .  FLUoxetine (PROZAC) 20 MG capsule, Take 20 mg by mouth daily. 2 BID, Disp: , Rfl:  .  gabapentin (NEURONTIN) 300 MG capsule, Take 3 capsules (900 mg total) by mouth 3 (three) times daily., Disp: 270 capsule, Rfl: 11 .  magnesium 30 MG tablet, Take 500 mg by mouth daily. , Disp: , Rfl:  .  Multiple Vitamin (MULTIVITAMIN) tablet, Take 1 tablet by mouth daily., Disp: ,  Rfl:  .  Multiple Vitamins-Minerals (EYE VITAMINS PO), Take by mouth. EYE vitamin, Disp: , Rfl:  .  progesterone (PROMETRIUM) 100 MG capsule, Take 100 mg by mouth daily.  , Disp: , Rfl:  .  traZODone (DESYREL) 150 MG tablet, Take by mouth., Disp: , Rfl:  .  zolpidem (AMBIEN CR) 12.5 MG CR tablet, Take 12.5 mg by mouth at bedtime as needed., Disp: , Rfl: 4 .  tiZANidine (ZANAFLEX) 4 MG capsule, Take 1 capsule (4 mg total) by mouth 2 (two) times daily as needed for muscle spasms. (Patient not taking: Reported on 03/26/2019), Disp: 180 capsule, Rfl: 1 .  traMADol (ULTRAM) 50 MG tablet,  Take 1 tablet (50 mg total) by mouth 3 (three) times daily as needed. (Patient not taking: Reported on 03/26/2019), Disp: 90 tablet, Rfl: 5 .  TURMERIC PO, Take by mouth., Disp: , Rfl:   Allergies  Allergen Reactions  . Shellfish Allergy Hives  . Contrast Media [Iodinated Diagnostic Agents] Hives, Itching and Rash    "lasted for months"    OBJECTIVE: Ht _0  (1.626 m)   Wt 146 lb 4 oz (66.3 kg)   BMI 25.10 kg/m  Gen: No acute distress. Nontoxic in appearance.  Well-developed, well-nourished, mildly overweight female. HENT: AT. [place].  MMM.  Eyes:Pupils Equal Round Reactive to light, Extraocular movements intact,  Conjunctiva without redness, discharge or icterus. CV: No edema Chest: Cough or shortness of breath not present on exam Skin: No rashes, purpura or petechiae.  Neuro:  Normal gait. Alert. Oriented x3  Psych: Normal affect, dress and demeanor. Normal speech. Normal thought content and judgment.  ASSESSMENT AND PLAN: ZARYIA MARKEL is a 48 y.o. female present for   Fatigue/Family history of systemic lupus erythematosus/myalgia/muscle spasm -Discussed with patient in length today there are multiple potential autoimmune disorders that could present with her symptoms, including lupus.  It is worth looking into enrolling out autoimmune disorder, well as ensuring B12, vitamin D and iron levels are all within normal range. -Advised her if she still taking the fenofibrate, to discontinue for 4 weeks.  She has been on fenofibrate since 2018 and could be causing some myalgias or muscle spasms- would not account for all of her constellation of symptoms though. -Her muscle spasms are at least controlled on gabapentin-however the high dose that she is on may be causing her more fatigue.  Offered trial baclofen in place the gabapentin and she declined. - Vitamin D (25 hydroxy); Future - B12; Future - Comp Met (CMET); Future - CBC with Differential/Platelet; Future - Iron, TIBC and Ferritin  Panel; Future - Protein, urine, random; Future - Sedimentation rate; Future - C-reactive protein; Future - Gliadin antibodies, serum; Future - Tissue transglutaminase, IgA; Future - Reticulin Antibody, IgA w reflex titer; Future - ANA, IFA Comprehensive Panel-([facility]); Future  Chronic diarrhea - avoid diary for trial.  - Vitamin D (25 hydroxy); Future - Sedimentation rate; Future - C-reactive protein; Future - Gliadin antibodies, serum; Future - Tissue transglutaminase, IgA; Future  Follow-up dependent upon lab results.  > 25 minutes spent with patient, >50% of time spent face to face counseling and/or coordinating care.     Howard Pouch, DO 03/26/2019

## 2019-03-27 DIAGNOSIS — M545 Low back pain: Secondary | ICD-10-CM | POA: Diagnosis not present

## 2019-03-27 DIAGNOSIS — M9903 Segmental and somatic dysfunction of lumbar region: Secondary | ICD-10-CM | POA: Diagnosis not present

## 2019-03-27 DIAGNOSIS — M542 Cervicalgia: Secondary | ICD-10-CM | POA: Diagnosis not present

## 2019-03-27 DIAGNOSIS — M5022 Other cervical disc displacement, mid-cervical region, unspecified level: Secondary | ICD-10-CM | POA: Diagnosis not present

## 2019-04-02 ENCOUNTER — Other Ambulatory Visit: Payer: Self-pay

## 2019-04-02 ENCOUNTER — Ambulatory Visit (INDEPENDENT_AMBULATORY_CARE_PROVIDER_SITE_OTHER): Payer: BLUE CROSS/BLUE SHIELD | Admitting: Family Medicine

## 2019-04-02 DIAGNOSIS — R5383 Other fatigue: Secondary | ICD-10-CM

## 2019-04-02 DIAGNOSIS — M791 Myalgia, unspecified site: Secondary | ICD-10-CM | POA: Diagnosis not present

## 2019-04-02 DIAGNOSIS — M62838 Other muscle spasm: Secondary | ICD-10-CM

## 2019-04-02 DIAGNOSIS — Z8269 Family history of other diseases of the musculoskeletal system and connective tissue: Secondary | ICD-10-CM

## 2019-04-02 DIAGNOSIS — K529 Noninfective gastroenteritis and colitis, unspecified: Secondary | ICD-10-CM | POA: Diagnosis not present

## 2019-04-02 NOTE — Addendum Note (Signed)
Addended by: Deja Kaigler M on: 04/02/2019 09:40 AM   Modules accepted: Orders  

## 2019-04-02 NOTE — Addendum Note (Signed)
Addended by: Eulah Pont on: 04/02/2019 09:40 AM   Modules accepted: Orders

## 2019-04-02 NOTE — Addendum Note (Signed)
Addended by: Monee Dembeck M on: 04/02/2019 09:40 AM   Modules accepted: Orders  

## 2019-04-02 NOTE — Addendum Note (Signed)
Addended by: November Sypher M on: 04/02/2019 09:40 AM   Modules accepted: Orders  

## 2019-04-02 NOTE — Addendum Note (Signed)
Addended by: Marnette Perkins M on: 04/02/2019 09:40 AM   Modules accepted: Orders  

## 2019-04-02 NOTE — Addendum Note (Signed)
Addended by: ALBRIGHT, LISA M on: 04/02/2019 09:40 AM   Modules accepted: Orders  

## 2019-04-03 LAB — PROTEIN, URINE, RANDOM: Total Protein, Urine: 12 mg/dL (ref 5–24)

## 2019-04-04 ENCOUNTER — Telehealth: Payer: Self-pay | Admitting: Family Medicine

## 2019-04-04 DIAGNOSIS — M62838 Other muscle spasm: Secondary | ICD-10-CM

## 2019-04-04 DIAGNOSIS — M791 Myalgia, unspecified site: Secondary | ICD-10-CM

## 2019-04-04 DIAGNOSIS — Z8269 Family history of other diseases of the musculoskeletal system and connective tissue: Secondary | ICD-10-CM

## 2019-04-04 DIAGNOSIS — R5383 Other fatigue: Secondary | ICD-10-CM

## 2019-04-04 NOTE — Telephone Encounter (Signed)
Please inform patient the following information: All her labs are normal.  If she has not stopped the fenofibrate>> I would recommend she stop this for 4 weeks and see if her symptoms resolve. Sometimes this can cause similar symptoms.  If she is still desiring further evaluation by Rheumatology would be happy to refer for further evaluation now or wait to see if stopping fenofibrate improves her symptoms.

## 2019-04-04 NOTE — Telephone Encounter (Signed)
Called and spoke with patient and she declines referral right now. She is going to call back if she decides she would like the referral. Pt verbalized understanding on lab results

## 2019-04-07 LAB — ANA, IFA COMPREHENSIVE PANEL
Anti Nuclear Antibody (ANA): NEGATIVE
ENA SM Ab Ser-aCnc: <1 AI
SM/RNP: <1 AI
SSA (Ro) (ENA) Antibody, IgG: <1 AI
SSB (La) (ENA) Antibody, IgG: <1 AI
Scleroderma (Scl-70) (ENA) Antibody, IgG: <1 AI
ds DNA Ab: <1 IU/mL

## 2019-04-07 LAB — COMPREHENSIVE METABOLIC PANEL
AG Ratio: 1.9 (calc) (ref 1.0–2.5)
ALT: 11 U/L (ref 6–29)
AST: 14 U/L (ref 10–35)
Albumin: 4.4 g/dL (ref 3.6–5.1)
Alkaline phosphatase (APISO): 50 U/L (ref 31–125)
BUN: 13 mg/dL (ref 7–25)
CO2: 23 mmol/L (ref 20–32)
Calcium: 9.6 mg/dL (ref 8.6–10.2)
Chloride: 108 mmol/L (ref 98–110)
Creat: 0.91 mg/dL (ref 0.50–1.10)
Globulin: 2.3 g/dL (calc) (ref 1.9–3.7)
Glucose, Bld: 114 mg/dL — ABNORMAL HIGH (ref 65–99)
Potassium: 4 mmol/L (ref 3.5–5.3)
Sodium: 141 mmol/L (ref 135–146)
Total Bilirubin: 0.4 mg/dL (ref 0.2–1.2)
Total Protein: 6.7 g/dL (ref 6.1–8.1)

## 2019-04-07 LAB — CBC WITH DIFFERENTIAL/PLATELET
Absolute Monocytes: 433 cells/uL (ref 200–950)
Basophils Absolute: 29 cells/uL (ref 0–200)
Basophils Relative: 0.5 %
Eosinophils Absolute: 80 cells/uL (ref 15–500)
Eosinophils Relative: 1.4 %
HCT: 34.9 % — ABNORMAL LOW (ref 35.0–45.0)
Hemoglobin: 11.8 g/dL (ref 11.7–15.5)
Lymphs Abs: 2565 cells/uL (ref 850–3900)
MCH: 30.5 pg (ref 27.0–33.0)
MCHC: 33.8 g/dL (ref 32.0–36.0)
MCV: 90.2 fL (ref 80.0–100.0)
MPV: 12.2 fL (ref 7.5–12.5)
Monocytes Relative: 7.6 %
Neutro Abs: 2594 cells/uL (ref 1500–7800)
Neutrophils Relative %: 45.5 %
Platelets: 323 10*3/uL (ref 140–400)
RBC: 3.87 10*6/uL (ref 3.80–5.10)
RDW: 11.6 % (ref 11.0–15.0)
Total Lymphocyte: 45 %
WBC: 5.7 10*3/uL (ref 3.8–10.8)

## 2019-04-07 LAB — C-REACTIVE PROTEIN: CRP: 2.1 mg/L (ref <8.0)

## 2019-04-07 LAB — GLIADIN ANTIBODIES, SERUM
Gliadin IgA: 5 Units
Gliadin IgG: 3 Units

## 2019-04-07 LAB — SEDIMENTATION RATE: Sed Rate: 9 mm/h (ref 0–20)

## 2019-04-07 LAB — RETICULIN ANTIBODIES, IGA W TITER: Reticulin IGA Screen: NEGATIVE

## 2019-04-07 LAB — IRON,TIBC AND FERRITIN PANEL
%SAT: 21 % (calc) (ref 16–45)
Ferritin: 141 ng/mL (ref 16–232)
Iron: 73 ug/dL (ref 40–190)
TIBC: 341 mcg/dL (calc) (ref 250–450)

## 2019-04-07 LAB — VITAMIN D 25 HYDROXY (VIT D DEFICIENCY, FRACTURES): Vit D, 25-Hydroxy: 31 ng/mL (ref 30–100)

## 2019-04-07 LAB — VITAMIN B12: Vitamin B-12: 1327 pg/mL — ABNORMAL HIGH (ref 200–1100)

## 2019-04-07 LAB — TISSUE TRANSGLUTAMINASE, IGA: (tTG) Ab, IgA: 1 U/mL

## 2019-04-10 DIAGNOSIS — M9903 Segmental and somatic dysfunction of lumbar region: Secondary | ICD-10-CM | POA: Diagnosis not present

## 2019-04-10 DIAGNOSIS — M542 Cervicalgia: Secondary | ICD-10-CM | POA: Diagnosis not present

## 2019-04-10 DIAGNOSIS — M5022 Other cervical disc displacement, mid-cervical region, unspecified level: Secondary | ICD-10-CM | POA: Diagnosis not present

## 2019-04-10 DIAGNOSIS — M545 Low back pain: Secondary | ICD-10-CM | POA: Diagnosis not present

## 2019-04-15 NOTE — Addendum Note (Signed)
Addended by: Caroll Rancher L on: 04/15/2019 09:20 AM   Modules accepted: Orders

## 2019-04-15 NOTE — Telephone Encounter (Signed)
Referral placed for Rheumatologist. Sent back to [place].

## 2019-04-15 NOTE — Telephone Encounter (Signed)
Patient has decided to pursue Dr. Lucita Lora recommendation to see a rheumatologist since she is still "hurting really bad". Please enter a referral. Thanks

## 2019-04-23 DIAGNOSIS — F319 Bipolar disorder, unspecified: Secondary | ICD-10-CM | POA: Diagnosis not present

## 2019-04-24 DIAGNOSIS — M545 Low back pain: Secondary | ICD-10-CM | POA: Diagnosis not present

## 2019-04-24 DIAGNOSIS — M5022 Other cervical disc displacement, mid-cervical region, unspecified level: Secondary | ICD-10-CM | POA: Diagnosis not present

## 2019-04-24 DIAGNOSIS — M542 Cervicalgia: Secondary | ICD-10-CM | POA: Diagnosis not present

## 2019-04-24 DIAGNOSIS — M9903 Segmental and somatic dysfunction of lumbar region: Secondary | ICD-10-CM | POA: Diagnosis not present

## 2019-04-29 DIAGNOSIS — H5319 Other subjective visual disturbances: Secondary | ICD-10-CM | POA: Diagnosis not present

## 2019-04-30 ENCOUNTER — Telehealth: Payer: Self-pay | Admitting: Neurology

## 2019-04-30 NOTE — Telephone Encounter (Signed)
Spoke with patient who stated she's having pain on the right side of neck and back, same areas she's had pain. She stated her PCP has referred her to a Rheumatologist for possible fibromyalgia, and she has appointment soon. She stated the "Gabapentin helped 90%", and she's much better. But every now and then she gets bad pain. I inquired if she's taking Tramadol, 50 mg up to three x day as needed. She stated she didn't have any more, no refills. I advised her that a RX was sent in 01/31/19, disp #90 tabs with 5 refills. She stated she never picked up the Rx, never knew it was there. I advised she continue taking Gabapentin, and call pharmacy for Tramadol refill.  She verbalized understanding, appreciation.

## 2019-04-30 NOTE — Telephone Encounter (Signed)
Pt states she is going through a lot of pain and is wondering if something can be called in for her besides the medication she is on already. Pt states pain is all over especially on the R side and shoulder. Please advise.

## 2019-05-13 ENCOUNTER — Other Ambulatory Visit: Payer: Self-pay

## 2019-05-13 ENCOUNTER — Ambulatory Visit (AMBULATORY_SURGERY_CENTER): Payer: BC Managed Care – PPO | Admitting: *Deleted

## 2019-05-13 VITALS — Ht 64.0 in | Wt 150.0 lb

## 2019-05-13 DIAGNOSIS — Z1211 Encounter for screening for malignant neoplasm of colon: Secondary | ICD-10-CM

## 2019-05-13 MED ORDER — SUPREP BOWEL PREP KIT 17.5-3.13-1.6 GM/177ML PO SOLN: 1.0000 | Freq: Once | ORAL | 0 refills | Status: AC

## 2019-05-13 NOTE — Progress Notes (Signed)
Patient denies any allergies to egg or soy products. Patient denies complications with anesthesia/sedation.  Patient denies oxygen use at home and denies diet medications.   Pt verified name, DOB, address and insurance during PV today. Pt mailed instruction packet to included paper to complete and mail back to [facility] with addressed and stamped envelope, Emmi video, copy of consent form to read and not return, and instructions.Suprep coupon mailed in packet. PV completed over the phone. Pt encouraged to call with questions or issues. 
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
## 2019-05-14 DIAGNOSIS — M9902 Segmental and somatic dysfunction of thoracic region: Secondary | ICD-10-CM | POA: Diagnosis not present

## 2019-05-14 DIAGNOSIS — M9901 Segmental and somatic dysfunction of cervical region: Secondary | ICD-10-CM | POA: Diagnosis not present

## 2019-05-14 DIAGNOSIS — M531 Cervicobrachial syndrome: Secondary | ICD-10-CM | POA: Diagnosis not present

## 2019-05-14 DIAGNOSIS — M9903 Segmental and somatic dysfunction of lumbar region: Secondary | ICD-10-CM | POA: Diagnosis not present

## 2019-05-19 DIAGNOSIS — M9903 Segmental and somatic dysfunction of lumbar region: Secondary | ICD-10-CM | POA: Diagnosis not present

## 2019-05-19 DIAGNOSIS — M545 Low back pain: Secondary | ICD-10-CM | POA: Diagnosis not present

## 2019-05-19 DIAGNOSIS — M5022 Other cervical disc displacement, mid-cervical region, unspecified level: Secondary | ICD-10-CM | POA: Diagnosis not present

## 2019-05-19 DIAGNOSIS — M542 Cervicalgia: Secondary | ICD-10-CM | POA: Diagnosis not present

## 2019-05-20 ENCOUNTER — Telehealth: Payer: Self-pay | Admitting: Gastroenterology

## 2019-05-20 DIAGNOSIS — M9903 Segmental and somatic dysfunction of lumbar region: Secondary | ICD-10-CM | POA: Diagnosis not present

## 2019-05-20 DIAGNOSIS — M9902 Segmental and somatic dysfunction of thoracic region: Secondary | ICD-10-CM | POA: Diagnosis not present

## 2019-05-20 DIAGNOSIS — M531 Cervicobrachial syndrome: Secondary | ICD-10-CM | POA: Diagnosis not present

## 2019-05-20 DIAGNOSIS — M9901 Segmental and somatic dysfunction of cervical region: Secondary | ICD-10-CM | POA: Diagnosis not present

## 2019-05-20 NOTE — Telephone Encounter (Signed)
Spoke with patient regarding the Covid-19 screening questions. °Covid-19 Screening Questions: ° °Do you now or have you had a fever in the last 14 days? no ° °Do you have any respiratory symptoms of shortness of breath or cough now or in the last 14 days? no ° °Do you have any family members or close contacts with diagnosed or suspected Covid-19 in the past 14 days? no ° °Have you been tested for Covid-19 and found to be positive? no ° °Pt made aware of that care partner may wait in the car or come up to the lobby during the procedure but will need to provide their own mask. °

## 2019-05-21 ENCOUNTER — Other Ambulatory Visit: Payer: Self-pay

## 2019-05-21 ENCOUNTER — Ambulatory Visit (AMBULATORY_SURGERY_CENTER): Payer: BC Managed Care – PPO | Admitting: Gastroenterology

## 2019-05-21 ENCOUNTER — Encounter: Payer: Self-pay | Admitting: Gastroenterology

## 2019-05-21 VITALS — BP 94/66 | HR 66 | Temp 98.6°F | Resp 13 | Ht 64.0 in | Wt 150.0 lb

## 2019-05-21 DIAGNOSIS — Z1211 Encounter for screening for malignant neoplasm of colon: Secondary | ICD-10-CM | POA: Diagnosis not present

## 2019-05-21 MED ORDER — SODIUM CHLORIDE 0.9 % IV SOLN: 500.0000 mL | Freq: Once | INTRAVENOUS | Status: DC

## 2019-05-21 NOTE — Progress Notes (Signed)
A/ox3, pleased with MAC, report to RN 

## 2019-05-21 NOTE — Op Note (Signed)
[facility] Patient Name: Alison CoffeeDiane Bradley Procedure Date: 05/21/2019 2:00 PM MRN: [id number] Endoscopist: Napoleon FormKavitha V. Ardelia Wrede , MD Age: [age] Referring MD:  Date of Birth: Dec 27, 1970 Gender: Female Account #: [id number] Procedure:                Colonoscopy Indications:              Screening for colorectal malignant neoplasm, This                            is the patient's first colonoscopy Medicines:                Monitored Anesthesia Care Procedure:                Pre-Anesthesia Assessment:                           - Prior to the procedure, a History and Physical                            was performed, and patient medications and                            allergies were reviewed. The patient's tolerance of                            previous anesthesia was also reviewed. The risks                            and benefits of the procedure and the sedation                            options and risks were discussed with the patient.                            All questions were answered, and informed consent                            was obtained. Prior Anticoagulants: The patient has                            taken no previous anticoagulant or antiplatelet                            agents. ASA Grade Assessment: II - A patient with                            mild systemic disease. After reviewing the risks                            and benefits, the patient was deemed in                            satisfactory condition to undergo the procedure.  After obtaining informed consent, the colonoscope                            was passed under direct vision. Throughout the                            procedure, the patient's blood pressure, pulse, and                            oxygen saturations were monitored continuously. The                            Model PCF-[id number]) scope was introduced                            through the anus and  advanced to the the cecum,                            identified by appendiceal orifice and ileocecal                            valve. The colonoscopy was performed without                            difficulty. The patient tolerated the procedure                            well. The quality of the bowel preparation was                            excellent. The ileocecal valve, appendiceal                            orifice, and rectum were photographed. Scope In: 2:04:29 PM Scope Out: 2:23:31 PM Scope Withdrawal Time: 0 hours 13 minutes 19 seconds  Total Procedure Duration: 0 hours 19 minutes 2 seconds  Findings:                 The perianal and digital rectal examinations were                            normal.                           The colon (entire examined portion) appeared normal. Complications:            No immediate complications. Estimated Blood Loss:     Estimated blood loss: none. Impression:               - The entire examined colon is normal.                           - No specimens collected. Recommendation:           - Patient has a contact number available for  emergencies. The signs and symptoms of potential                            delayed complications were discussed with the                            patient. Return to normal activities tomorrow.                            Written discharge instructions were provided to the                            patient.                           - Resume previous diet.                           - Continue present medications.                           - Repeat colonoscopy in 10 years for screening                            purposes. Napoleon FormKavitha V. Matai Carpenito, MD 05/21/2019 2:30:16 PM This report has been signed electronically.

## 2019-05-21 NOTE — Progress Notes (Signed)
Temp- Alison Bradley VS- Alison Bradley  Pt's states no medical or surgical changes since previsit or office visit.  

## 2019-05-21 NOTE — Patient Instructions (Signed)
Please read handouts provided. Continue present medications.     YOU HAD AN ENDOSCOPIC PROCEDURE TODAY AT THE [facility]:   Refer to the procedure report that was given to you for any specific questions about what was found during the examination.  If the procedure report does not answer your questions, please call your gastroenterologist to clarify.  If you requested that your care partner not be given the details of your procedure findings, then the procedure report has been included in a sealed envelope for you to review at your convenience later.  YOU SHOULD EXPECT: Some feelings of bloating in the abdomen. Passage of more gas than usual.  Walking can help get rid of the air that was put into your GI tract during the procedure and reduce the bloating. If you had a lower endoscopy (such as a colonoscopy or flexible sigmoidoscopy) you may notice spotting of blood in your stool or on the toilet paper. If you underwent a bowel prep for your procedure, you may not have a normal bowel movement for a few days.  Please Note:  You might notice some irritation and congestion in your nose or some drainage.  This is from the oxygen used during your procedure.  There is no need for concern and it should clear up in a day or so.  SYMPTOMS TO REPORT IMMEDIATELY:   Following lower endoscopy (colonoscopy or flexible sigmoidoscopy):  Excessive amounts of blood in the stool  Significant tenderness or worsening of abdominal pains  Swelling of the abdomen that is new, acute  Fever of 100F or higher   For urgent or emergent issues, a gastroenterologist can be reached at any hour by calling [phone].   DIET:  We do recommend a small meal at first, but then you may proceed to your regular diet.  Drink plenty of fluids but you should avoid alcoholic beverages for 24 hours.  ACTIVITY:  You should plan to take it easy for the rest of today and you should NOT DRIVE or use heavy machinery  until tomorrow (because of the sedation medicines used during the test).    FOLLOW UP: Our staff will call the number listed on your records 48-72 hours following your procedure to check on you and address any questions or concerns that you may have regarding the information given to you following your procedure. If we do not reach you, we will leave a message.  We will attempt to reach you two times.  During this call, we will ask if you have developed any symptoms of COVID 19. If you develop any symptoms (ie: fever, flu-like symptoms, shortness of breath, cough etc.) before then, please call [phone].  If you test positive for Covid 19 in the 2 weeks post procedure, please call and report this information to us.    If any biopsies were taken you will be contacted by phone or by letter within the next 1-3 weeks.  Please call us at [phone] if you have not heard about the biopsies in 3 weeks.    SIGNATURES/CONFIDENTIALITY: You and/or your care partner have signed paperwork which will be entered into your electronic medical record.  These signatures attest to the fact that that the information above on your After Visit Summary has been reviewed and is understood.  Full responsibility of the confidentiality of this discharge information lies with you and/or your care-partner. 

## 2019-05-22 ENCOUNTER — Encounter: Payer: Self-pay | Admitting: Rheumatology

## 2019-05-22 ENCOUNTER — Telehealth: Payer: Self-pay | Admitting: Neurology

## 2019-05-22 ENCOUNTER — Ambulatory Visit (INDEPENDENT_AMBULATORY_CARE_PROVIDER_SITE_OTHER): Payer: BC Managed Care – PPO | Admitting: Rheumatology

## 2019-05-22 VITALS — BP 74/51 | HR 86 | Resp 14 | Ht 64.0 in | Wt 149.0 lb

## 2019-05-22 DIAGNOSIS — R5383 Other fatigue: Secondary | ICD-10-CM

## 2019-05-22 DIAGNOSIS — E781 Pure hyperglyceridemia: Secondary | ICD-10-CM

## 2019-05-22 DIAGNOSIS — K529 Noninfective gastroenteritis and colitis, unspecified: Secondary | ICD-10-CM

## 2019-05-22 DIAGNOSIS — G5601 Carpal tunnel syndrome, right upper limb: Secondary | ICD-10-CM

## 2019-05-22 DIAGNOSIS — M5412 Radiculopathy, cervical region: Secondary | ICD-10-CM

## 2019-05-22 DIAGNOSIS — Z8269 Family history of other diseases of the musculoskeletal system and connective tissue: Secondary | ICD-10-CM

## 2019-05-22 DIAGNOSIS — Z8659 Personal history of other mental and behavioral disorders: Secondary | ICD-10-CM

## 2019-05-22 DIAGNOSIS — G5621 Lesion of ulnar nerve, right upper limb: Secondary | ICD-10-CM

## 2019-05-22 DIAGNOSIS — M62838 Other muscle spasm: Secondary | ICD-10-CM

## 2019-05-22 DIAGNOSIS — E28319 Asymptomatic premature menopause: Secondary | ICD-10-CM

## 2019-05-22 DIAGNOSIS — M791 Myalgia, unspecified site: Secondary | ICD-10-CM

## 2019-05-22 NOTE — Telephone Encounter (Signed)
Pt called in and stated she would like a call back to discuss her pain , she states she was referred to Rheumatology and she was told she needs a referral from Dr Felecia Shelling to see a neuromuscular specialist at [facility].

## 2019-05-22 NOTE — Telephone Encounter (Signed)
We can refer for a second opinion if she would like "myalgias"  Pain in the right arm could be from the ulnar neuropathy... 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

## 2019-05-22 NOTE — Telephone Encounter (Signed)
Dr. Sater- are you ok with placing this referral? 

## 2019-05-23 ENCOUNTER — Telehealth: Payer: Self-pay | Admitting: *Deleted

## 2019-05-23 NOTE — Telephone Encounter (Signed)
1. Have you developed a fever since your procedure? no  2.   Have you had an respiratory symptoms (SOB or cough) since your procedure? no  3.   Have you tested positive for COVID 19 since your procedure no  4.   Have you had any family members/close contacts diagnosed with the COVID 19 since your procedure?  no   If yes to any of these questions please route to Joylene John, RN and Alphonsa Gin, Therapist, sports.  Follow up Call-  Call back number 05/21/2019  Post procedure Call Back phone  # [phone]  Permission to leave phone message Yes  Some recent data might be hidden     Patient questions:  Do you have a fever, pain , or abdominal swelling? No. Pain Score  0 *  Have you tolerated food without any problems? Yes.    Have you been able to return to your normal activities? Yes.    Do you have any questions about your discharge instructions: Diet   No. Medications  No. Follow up visit  No.  Do you have questions or concerns about your Care? No.  Actions: * If pain score is 4 or above: No action needed, pain <4.

## 2019-05-26 NOTE — Telephone Encounter (Signed)
Called, LVM relaying Dr. Garth Bigness message. Asked her to call back to further discuss how she would like to proceed

## 2019-05-27 NOTE — Telephone Encounter (Signed)
Pt returned call and stated she would rather discuss at her appt in September, she doesn't really want to go to an ortho doctor.

## 2019-05-27 NOTE — Telephone Encounter (Signed)
Called, LVM for pt to call office is she still wanted to discuss sx/referral

## 2019-06-09 DIAGNOSIS — M9903 Segmental and somatic dysfunction of lumbar region: Secondary | ICD-10-CM | POA: Diagnosis not present

## 2019-06-09 DIAGNOSIS — M531 Cervicobrachial syndrome: Secondary | ICD-10-CM | POA: Diagnosis not present

## 2019-06-09 DIAGNOSIS — M9901 Segmental and somatic dysfunction of cervical region: Secondary | ICD-10-CM | POA: Diagnosis not present

## 2019-06-09 DIAGNOSIS — M9902 Segmental and somatic dysfunction of thoracic region: Secondary | ICD-10-CM | POA: Diagnosis not present

## 2019-06-23 ENCOUNTER — Ambulatory Visit: Payer: BLUE CROSS/BLUE SHIELD | Admitting: Rheumatology

## 2019-06-30 DIAGNOSIS — Z20828 Contact with and (suspected) exposure to other viral communicable diseases: Secondary | ICD-10-CM | POA: Diagnosis not present

## 2019-07-02 DIAGNOSIS — Z1231 Encounter for screening mammogram for malignant neoplasm of breast: Secondary | ICD-10-CM | POA: Diagnosis not present

## 2019-07-02 DIAGNOSIS — N951 Menopausal and female climacteric states: Secondary | ICD-10-CM | POA: Diagnosis not present

## 2019-07-02 DIAGNOSIS — Z13820 Encounter for screening for osteoporosis: Secondary | ICD-10-CM | POA: Diagnosis not present

## 2019-07-02 DIAGNOSIS — N952 Postmenopausal atrophic vaginitis: Secondary | ICD-10-CM | POA: Diagnosis not present

## 2019-07-02 DIAGNOSIS — Z6825 Body mass index (BMI) 25.0-25.9, adult: Secondary | ICD-10-CM | POA: Diagnosis not present

## 2019-07-02 DIAGNOSIS — Z01419 Encounter for gynecological examination (general) (routine) without abnormal findings: Secondary | ICD-10-CM | POA: Diagnosis not present

## 2019-07-03 ENCOUNTER — Telehealth: Payer: Self-pay

## 2019-07-03 NOTE — Telephone Encounter (Signed)
Pt called and asked if Dr Raoul Pitch would do trigger point injections for her pain. She said she was in so much pain in her shoulders and feels the injections would help. Please advise.

## 2019-07-07 NOTE — Telephone Encounter (Signed)
Pt was called and detailed message was left on patients VM.  

## 2019-07-07 NOTE — Telephone Encounter (Signed)
Pt sent mychart message

## 2019-07-07 NOTE — Telephone Encounter (Signed)
Please inform patient the following information: I perform back/thoracic trigger points, If she is looking for more intensive injections we can refer her to pain management - Dr. Dianna Limbo performs.

## 2019-07-10 ENCOUNTER — Other Ambulatory Visit: Payer: Self-pay | Admitting: Obstetrics and Gynecology

## 2019-07-10 DIAGNOSIS — Z9189 Other specified personal risk factors, not elsewhere classified: Secondary | ICD-10-CM

## 2019-07-16 DIAGNOSIS — F319 Bipolar disorder, unspecified: Secondary | ICD-10-CM | POA: Diagnosis not present

## 2019-07-21 DIAGNOSIS — M9903 Segmental and somatic dysfunction of lumbar region: Secondary | ICD-10-CM | POA: Diagnosis not present

## 2019-07-21 DIAGNOSIS — M9901 Segmental and somatic dysfunction of cervical region: Secondary | ICD-10-CM | POA: Diagnosis not present

## 2019-07-21 DIAGNOSIS — M531 Cervicobrachial syndrome: Secondary | ICD-10-CM | POA: Diagnosis not present

## 2019-07-21 DIAGNOSIS — M9902 Segmental and somatic dysfunction of thoracic region: Secondary | ICD-10-CM | POA: Diagnosis not present

## 2019-07-24 DIAGNOSIS — M531 Cervicobrachial syndrome: Secondary | ICD-10-CM | POA: Diagnosis not present

## 2019-07-24 DIAGNOSIS — M9902 Segmental and somatic dysfunction of thoracic region: Secondary | ICD-10-CM | POA: Diagnosis not present

## 2019-07-24 DIAGNOSIS — M9901 Segmental and somatic dysfunction of cervical region: Secondary | ICD-10-CM | POA: Diagnosis not present

## 2019-07-24 DIAGNOSIS — M9903 Segmental and somatic dysfunction of lumbar region: Secondary | ICD-10-CM | POA: Diagnosis not present

## 2019-07-28 DIAGNOSIS — M531 Cervicobrachial syndrome: Secondary | ICD-10-CM | POA: Diagnosis not present

## 2019-07-28 DIAGNOSIS — M9903 Segmental and somatic dysfunction of lumbar region: Secondary | ICD-10-CM | POA: Diagnosis not present

## 2019-07-28 DIAGNOSIS — M9902 Segmental and somatic dysfunction of thoracic region: Secondary | ICD-10-CM | POA: Diagnosis not present

## 2019-07-28 DIAGNOSIS — M9901 Segmental and somatic dysfunction of cervical region: Secondary | ICD-10-CM | POA: Diagnosis not present

## 2019-07-30 DIAGNOSIS — F319 Bipolar disorder, unspecified: Secondary | ICD-10-CM | POA: Diagnosis not present

## 2019-08-01 DIAGNOSIS — M9901 Segmental and somatic dysfunction of cervical region: Secondary | ICD-10-CM | POA: Diagnosis not present

## 2019-08-01 DIAGNOSIS — S79922A Unspecified injury of left thigh, initial encounter: Secondary | ICD-10-CM | POA: Diagnosis not present

## 2019-08-01 DIAGNOSIS — M9903 Segmental and somatic dysfunction of lumbar region: Secondary | ICD-10-CM | POA: Diagnosis not present

## 2019-08-01 DIAGNOSIS — M79652 Pain in left thigh: Secondary | ICD-10-CM | POA: Diagnosis not present

## 2019-08-01 DIAGNOSIS — M9902 Segmental and somatic dysfunction of thoracic region: Secondary | ICD-10-CM | POA: Diagnosis not present

## 2019-08-01 DIAGNOSIS — M531 Cervicobrachial syndrome: Secondary | ICD-10-CM | POA: Diagnosis not present

## 2019-08-04 DIAGNOSIS — S46811A Strain of other muscles, fascia and tendons at shoulder and upper arm level, right arm, initial encounter: Secondary | ICD-10-CM | POA: Diagnosis not present

## 2019-08-05 DIAGNOSIS — R269 Unspecified abnormalities of gait and mobility: Secondary | ICD-10-CM | POA: Diagnosis not present

## 2019-08-05 DIAGNOSIS — M6281 Muscle weakness (generalized): Secondary | ICD-10-CM | POA: Diagnosis not present

## 2019-08-05 DIAGNOSIS — M79652 Pain in left thigh: Secondary | ICD-10-CM | POA: Diagnosis not present

## 2019-08-05 DIAGNOSIS — S76312A Strain of muscle, fascia and tendon of the posterior muscle group at thigh level, left thigh, initial encounter: Secondary | ICD-10-CM | POA: Diagnosis not present

## 2019-08-06 ENCOUNTER — Ambulatory Visit (INDEPENDENT_AMBULATORY_CARE_PROVIDER_SITE_OTHER): Payer: BC Managed Care – PPO | Admitting: Neurology

## 2019-08-06 ENCOUNTER — Other Ambulatory Visit: Payer: Self-pay

## 2019-08-06 ENCOUNTER — Encounter: Payer: Self-pay | Admitting: Neurology

## 2019-08-06 VITALS — BP 110/70 | HR 82 | Temp 97.7°F | Ht 64.0 in | Wt 144.6 lb

## 2019-08-06 DIAGNOSIS — G5621 Lesion of ulnar nerve, right upper limb: Secondary | ICD-10-CM

## 2019-08-06 DIAGNOSIS — M5412 Radiculopathy, cervical region: Secondary | ICD-10-CM

## 2019-08-06 DIAGNOSIS — M62838 Other muscle spasm: Secondary | ICD-10-CM

## 2019-08-06 DIAGNOSIS — M542 Cervicalgia: Secondary | ICD-10-CM | POA: Diagnosis not present

## 2019-08-06 DIAGNOSIS — G5601 Carpal tunnel syndrome, right upper limb: Secondary | ICD-10-CM

## 2019-08-06 MED ORDER — TRAMADOL HCL 50 MG PO TABS: 50.0000 mg | ORAL_TABLET | Freq: Four times a day (QID) | ORAL | 5 refills | Status: DC | PRN
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
## 2019-08-08 DIAGNOSIS — S76312A Strain of muscle, fascia and tendon of the posterior muscle group at thigh level, left thigh, initial encounter: Secondary | ICD-10-CM | POA: Diagnosis not present

## 2019-08-08 DIAGNOSIS — M6281 Muscle weakness (generalized): Secondary | ICD-10-CM | POA: Diagnosis not present

## 2019-08-08 DIAGNOSIS — R269 Unspecified abnormalities of gait and mobility: Secondary | ICD-10-CM | POA: Diagnosis not present

## 2019-08-08 DIAGNOSIS — M79652 Pain in left thigh: Secondary | ICD-10-CM | POA: Diagnosis not present

## 2019-08-12 DIAGNOSIS — M6281 Muscle weakness (generalized): Secondary | ICD-10-CM | POA: Diagnosis not present

## 2019-08-12 DIAGNOSIS — S76312A Strain of muscle, fascia and tendon of the posterior muscle group at thigh level, left thigh, initial encounter: Secondary | ICD-10-CM | POA: Diagnosis not present

## 2019-08-12 DIAGNOSIS — M79652 Pain in left thigh: Secondary | ICD-10-CM | POA: Diagnosis not present

## 2019-08-12 DIAGNOSIS — R269 Unspecified abnormalities of gait and mobility: Secondary | ICD-10-CM | POA: Diagnosis not present

## 2019-08-29 DIAGNOSIS — S76312A Strain of muscle, fascia and tendon of the posterior muscle group at thigh level, left thigh, initial encounter: Secondary | ICD-10-CM | POA: Diagnosis not present

## 2019-08-29 DIAGNOSIS — M79652 Pain in left thigh: Secondary | ICD-10-CM | POA: Diagnosis not present

## 2019-08-29 DIAGNOSIS — R269 Unspecified abnormalities of gait and mobility: Secondary | ICD-10-CM | POA: Diagnosis not present

## 2019-08-29 DIAGNOSIS — M6281 Muscle weakness (generalized): Secondary | ICD-10-CM | POA: Diagnosis not present

## 2019-09-09 DIAGNOSIS — M531 Cervicobrachial syndrome: Secondary | ICD-10-CM | POA: Diagnosis not present

## 2019-09-09 DIAGNOSIS — M9901 Segmental and somatic dysfunction of cervical region: Secondary | ICD-10-CM | POA: Diagnosis not present

## 2019-09-09 DIAGNOSIS — M9903 Segmental and somatic dysfunction of lumbar region: Secondary | ICD-10-CM | POA: Diagnosis not present

## 2019-09-09 DIAGNOSIS — M9902 Segmental and somatic dysfunction of thoracic region: Secondary | ICD-10-CM | POA: Diagnosis not present

## 2019-09-16 DIAGNOSIS — M531 Cervicobrachial syndrome: Secondary | ICD-10-CM | POA: Diagnosis not present

## 2019-09-16 DIAGNOSIS — M9903 Segmental and somatic dysfunction of lumbar region: Secondary | ICD-10-CM | POA: Diagnosis not present

## 2019-09-16 DIAGNOSIS — M9901 Segmental and somatic dysfunction of cervical region: Secondary | ICD-10-CM | POA: Diagnosis not present

## 2019-09-16 DIAGNOSIS — M9902 Segmental and somatic dysfunction of thoracic region: Secondary | ICD-10-CM | POA: Diagnosis not present

## 2019-09-22 DIAGNOSIS — M9902 Segmental and somatic dysfunction of thoracic region: Secondary | ICD-10-CM | POA: Diagnosis not present

## 2019-09-22 DIAGNOSIS — M9901 Segmental and somatic dysfunction of cervical region: Secondary | ICD-10-CM | POA: Diagnosis not present

## 2019-09-22 DIAGNOSIS — M531 Cervicobrachial syndrome: Secondary | ICD-10-CM | POA: Diagnosis not present

## 2019-09-22 DIAGNOSIS — M9903 Segmental and somatic dysfunction of lumbar region: Secondary | ICD-10-CM | POA: Diagnosis not present

## 2019-09-25 DIAGNOSIS — F319 Bipolar disorder, unspecified: Secondary | ICD-10-CM | POA: Diagnosis not present

## 2019-10-01 DIAGNOSIS — M9902 Segmental and somatic dysfunction of thoracic region: Secondary | ICD-10-CM | POA: Diagnosis not present

## 2019-10-01 DIAGNOSIS — M531 Cervicobrachial syndrome: Secondary | ICD-10-CM | POA: Diagnosis not present

## 2019-10-01 DIAGNOSIS — M9901 Segmental and somatic dysfunction of cervical region: Secondary | ICD-10-CM | POA: Diagnosis not present

## 2019-10-01 DIAGNOSIS — M9903 Segmental and somatic dysfunction of lumbar region: Secondary | ICD-10-CM | POA: Diagnosis not present

## 2019-10-10 DIAGNOSIS — M9902 Segmental and somatic dysfunction of thoracic region: Secondary | ICD-10-CM | POA: Diagnosis not present

## 2019-10-10 DIAGNOSIS — M531 Cervicobrachial syndrome: Secondary | ICD-10-CM | POA: Diagnosis not present

## 2019-10-10 DIAGNOSIS — M9903 Segmental and somatic dysfunction of lumbar region: Secondary | ICD-10-CM | POA: Diagnosis not present

## 2019-10-10 DIAGNOSIS — M9901 Segmental and somatic dysfunction of cervical region: Secondary | ICD-10-CM | POA: Diagnosis not present

## 2019-10-16 DIAGNOSIS — M9901 Segmental and somatic dysfunction of cervical region: Secondary | ICD-10-CM | POA: Diagnosis not present

## 2019-10-16 DIAGNOSIS — M9903 Segmental and somatic dysfunction of lumbar region: Secondary | ICD-10-CM | POA: Diagnosis not present

## 2019-10-16 DIAGNOSIS — M9902 Segmental and somatic dysfunction of thoracic region: Secondary | ICD-10-CM | POA: Diagnosis not present

## 2019-10-16 DIAGNOSIS — M531 Cervicobrachial syndrome: Secondary | ICD-10-CM | POA: Diagnosis not present

## 2019-11-05 DIAGNOSIS — M531 Cervicobrachial syndrome: Secondary | ICD-10-CM | POA: Diagnosis not present

## 2019-11-05 DIAGNOSIS — M9901 Segmental and somatic dysfunction of cervical region: Secondary | ICD-10-CM | POA: Diagnosis not present

## 2019-11-05 DIAGNOSIS — M9902 Segmental and somatic dysfunction of thoracic region: Secondary | ICD-10-CM | POA: Diagnosis not present

## 2019-11-05 DIAGNOSIS — M9903 Segmental and somatic dysfunction of lumbar region: Secondary | ICD-10-CM | POA: Diagnosis not present

## 2019-11-17 DIAGNOSIS — M4722 Other spondylosis with radiculopathy, cervical region: Secondary | ICD-10-CM | POA: Diagnosis not present

## 2019-11-17 DIAGNOSIS — M542 Cervicalgia: Secondary | ICD-10-CM | POA: Diagnosis not present

## 2019-11-30 DIAGNOSIS — M5431 Sciatica, right side: Secondary | ICD-10-CM | POA: Diagnosis not present

## 2019-11-30 DIAGNOSIS — M2669 Other specified disorders of temporomandibular joint: Secondary | ICD-10-CM | POA: Diagnosis not present

## 2019-12-10 ENCOUNTER — Ambulatory Visit (INDEPENDENT_AMBULATORY_CARE_PROVIDER_SITE_OTHER): Payer: BC Managed Care – PPO | Admitting: Neurology

## 2019-12-10 ENCOUNTER — Encounter: Payer: Self-pay | Admitting: Neurology

## 2019-12-10 ENCOUNTER — Other Ambulatory Visit: Payer: Self-pay

## 2019-12-10 VITALS — BP 110/70 | HR 76 | Temp 97.5°F | Ht 64.0 in | Wt 156.0 lb

## 2019-12-10 DIAGNOSIS — G4489 Other headache syndrome: Secondary | ICD-10-CM | POA: Insufficient documentation

## 2019-12-10 DIAGNOSIS — M542 Cervicalgia: Secondary | ICD-10-CM | POA: Diagnosis not present

## 2019-12-10 DIAGNOSIS — M5412 Radiculopathy, cervical region: Secondary | ICD-10-CM

## 2019-12-10 DIAGNOSIS — E348 Other specified endocrine disorders: Secondary | ICD-10-CM | POA: Insufficient documentation

## 2019-12-10 DIAGNOSIS — R269 Unspecified abnormalities of gait and mobility: Secondary | ICD-10-CM

## 2019-12-10 DIAGNOSIS — M62838 Other muscle spasm: Secondary | ICD-10-CM

## 2019-12-10 DIAGNOSIS — F418 Other specified anxiety disorders: Secondary | ICD-10-CM

## 2019-12-10 DIAGNOSIS — G4700 Insomnia, unspecified: Secondary | ICD-10-CM

## 2019-12-10 MED ORDER — DIAZEPAM 5 MG PO TABS: 5.0000 mg | ORAL_TABLET | Freq: Two times a day (BID) | ORAL | 0 refills | Status: DC | PRN
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
## 2019-12-11 ENCOUNTER — Telehealth: Payer: Self-pay | Admitting: *Deleted

## 2019-12-11 LAB — COMPREHENSIVE METABOLIC PANEL
ALT: 10 IU/L (ref 0–32)
AST: 7 IU/L (ref 0–40)
Albumin/Globulin Ratio: 1.9 (ref 1.2–2.2)
Albumin: 4.1 g/dL (ref 3.8–4.8)
Alkaline Phosphatase: 68 IU/L (ref 39–117)
BUN/Creatinine Ratio: 25 — ABNORMAL HIGH (ref 9–23)
BUN: 18 mg/dL (ref 6–24)
Bilirubin Total: <0.2 mg/dL (ref 0.0–1.2)
CO2: 26 mmol/L (ref 20–29)
Calcium: 9.6 mg/dL (ref 8.7–10.2)
Chloride: 100 mmol/L (ref 96–106)
Creatinine, Ser: 0.72 mg/dL (ref 0.57–1.00)
GFR calc Af Amer: 113 mL/min/{1.73_m2} (ref >59)
GFR calc non Af Amer: 98 mL/min/{1.73_m2} (ref >59)
Globulin, Total: 2.2 g/dL (ref 1.5–4.5)
Glucose: 91 mg/dL (ref 65–99)
Potassium: 4.7 mmol/L (ref 3.5–5.2)
Sodium: 138 mmol/L (ref 134–144)
Total Protein: 6.3 g/dL (ref 6.0–8.5)

## 2019-12-11 LAB — CK: Total CK: 37 U/L (ref 32–182)

## 2019-12-11 NOTE — Telephone Encounter (Signed)
-----   Message from Asa Lente, MD sent at 12/11/2019  2:54 PM EST ----- Labs were normal

## 2020-01-29 DIAGNOSIS — F319 Bipolar disorder, unspecified: Secondary | ICD-10-CM | POA: Diagnosis not present

## 2020-02-03 ENCOUNTER — Telehealth: Payer: Self-pay | Admitting: Neurology

## 2020-02-03 MED ORDER — DIAZEPAM 5 MG PO TABS: 5.0000 mg | ORAL_TABLET | Freq: Two times a day (BID) | ORAL | 0 refills | Status: DC | PRN

## 2020-02-03 NOTE — Telephone Encounter (Signed)
1) Medication(s) Requested (by name): diazepam (VALIUM) 5 MG tablet   2) Pharmacy of Choice: [id number] - [place], [place] - [address]  [address], [place] [address]

## 2020-02-04 ENCOUNTER — Ambulatory Visit: Payer: BC Managed Care – PPO | Admitting: Neurology

## 2020-02-05 ENCOUNTER — Other Ambulatory Visit: Payer: Self-pay | Admitting: Neurology

## 2020-02-09 DIAGNOSIS — M9901 Segmental and somatic dysfunction of cervical region: Secondary | ICD-10-CM | POA: Diagnosis not present

## 2020-02-09 DIAGNOSIS — M9903 Segmental and somatic dysfunction of lumbar region: Secondary | ICD-10-CM | POA: Diagnosis not present

## 2020-02-09 DIAGNOSIS — M531 Cervicobrachial syndrome: Secondary | ICD-10-CM | POA: Diagnosis not present

## 2020-02-09 DIAGNOSIS — M9902 Segmental and somatic dysfunction of thoracic region: Secondary | ICD-10-CM | POA: Diagnosis not present

## 2020-02-13 DIAGNOSIS — M9902 Segmental and somatic dysfunction of thoracic region: Secondary | ICD-10-CM | POA: Diagnosis not present

## 2020-02-13 DIAGNOSIS — M9901 Segmental and somatic dysfunction of cervical region: Secondary | ICD-10-CM | POA: Diagnosis not present

## 2020-02-13 DIAGNOSIS — M531 Cervicobrachial syndrome: Secondary | ICD-10-CM | POA: Diagnosis not present

## 2020-02-13 DIAGNOSIS — M9903 Segmental and somatic dysfunction of lumbar region: Secondary | ICD-10-CM | POA: Diagnosis not present

## 2020-02-19 ENCOUNTER — Telehealth: Payer: Self-pay | Admitting: *Deleted

## 2020-02-19 NOTE — Telephone Encounter (Signed)
[id number]:[id number];Review Type:Qty;Coverage Start Date:01/20/2020;Coverage End Date:02/18/2021;

## 2020-02-19 NOTE — Telephone Encounter (Addendum)
Received fax from [facility] that current PA on file for gabapentin expiring soon. I renewed PA from last year. I on CMM. In process of completing. Key:  [id number] - PA Case ID: [id number].   Submitted PA. Waiting on determination from [facility]

## 2020-02-24 DIAGNOSIS — M9903 Segmental and somatic dysfunction of lumbar region: Secondary | ICD-10-CM | POA: Diagnosis not present

## 2020-02-24 DIAGNOSIS — M9902 Segmental and somatic dysfunction of thoracic region: Secondary | ICD-10-CM | POA: Diagnosis not present

## 2020-02-24 DIAGNOSIS — M531 Cervicobrachial syndrome: Secondary | ICD-10-CM | POA: Diagnosis not present

## 2020-02-24 DIAGNOSIS — M9901 Segmental and somatic dysfunction of cervical region: Secondary | ICD-10-CM | POA: Diagnosis not present

## 2020-04-06 DIAGNOSIS — M9903 Segmental and somatic dysfunction of lumbar region: Secondary | ICD-10-CM | POA: Diagnosis not present

## 2020-04-06 DIAGNOSIS — M531 Cervicobrachial syndrome: Secondary | ICD-10-CM | POA: Diagnosis not present

## 2020-04-06 DIAGNOSIS — M9901 Segmental and somatic dysfunction of cervical region: Secondary | ICD-10-CM | POA: Diagnosis not present

## 2020-04-06 DIAGNOSIS — M9902 Segmental and somatic dysfunction of thoracic region: Secondary | ICD-10-CM | POA: Diagnosis not present

## 2020-05-06 DIAGNOSIS — M531 Cervicobrachial syndrome: Secondary | ICD-10-CM | POA: Diagnosis not present

## 2020-05-06 DIAGNOSIS — M9901 Segmental and somatic dysfunction of cervical region: Secondary | ICD-10-CM | POA: Diagnosis not present

## 2020-05-06 DIAGNOSIS — M9902 Segmental and somatic dysfunction of thoracic region: Secondary | ICD-10-CM | POA: Diagnosis not present

## 2020-05-06 DIAGNOSIS — M9903 Segmental and somatic dysfunction of lumbar region: Secondary | ICD-10-CM | POA: Diagnosis not present

## 2020-06-05 DIAGNOSIS — M9902 Segmental and somatic dysfunction of thoracic region: Secondary | ICD-10-CM | POA: Diagnosis not present

## 2020-06-05 DIAGNOSIS — M9903 Segmental and somatic dysfunction of lumbar region: Secondary | ICD-10-CM | POA: Diagnosis not present

## 2020-06-05 DIAGNOSIS — M531 Cervicobrachial syndrome: Secondary | ICD-10-CM | POA: Diagnosis not present

## 2020-06-05 DIAGNOSIS — M9901 Segmental and somatic dysfunction of cervical region: Secondary | ICD-10-CM | POA: Diagnosis not present

## 2020-06-09 DIAGNOSIS — M9902 Segmental and somatic dysfunction of thoracic region: Secondary | ICD-10-CM | POA: Diagnosis not present

## 2020-06-09 DIAGNOSIS — M531 Cervicobrachial syndrome: Secondary | ICD-10-CM | POA: Diagnosis not present

## 2020-06-09 DIAGNOSIS — M9903 Segmental and somatic dysfunction of lumbar region: Secondary | ICD-10-CM | POA: Diagnosis not present

## 2020-06-09 DIAGNOSIS — M9901 Segmental and somatic dysfunction of cervical region: Secondary | ICD-10-CM | POA: Diagnosis not present

## 2020-06-10 ENCOUNTER — Encounter: Payer: Self-pay | Admitting: Neurology

## 2020-06-10 ENCOUNTER — Ambulatory Visit (INDEPENDENT_AMBULATORY_CARE_PROVIDER_SITE_OTHER): Payer: BC Managed Care – PPO | Admitting: Neurology

## 2020-06-10 VITALS — BP 105/60 | HR 86 | Ht 64.0 in | Wt 165.0 lb

## 2020-06-10 DIAGNOSIS — R519 Headache, unspecified: Secondary | ICD-10-CM | POA: Diagnosis not present

## 2020-06-10 DIAGNOSIS — M79604 Pain in right leg: Secondary | ICD-10-CM | POA: Diagnosis not present

## 2020-06-10 DIAGNOSIS — G930 Cerebral cysts: Secondary | ICD-10-CM | POA: Diagnosis not present

## 2020-06-10 DIAGNOSIS — M5416 Radiculopathy, lumbar region: Secondary | ICD-10-CM

## 2020-06-10 MED ORDER — DIAZEPAM 5 MG PO TABS: 5.0000 mg | ORAL_TABLET | Freq: Two times a day (BID) | ORAL | 2 refills | Status: DC | PRN

## 2020-06-10 MED ORDER — TRAMADOL HCL 50 MG PO TABS: 50.0000 mg | ORAL_TABLET | Freq: Four times a day (QID) | ORAL | 5 refills | Status: DC | PRN

## 2020-06-10 MED ORDER — GABAPENTIN 300 MG PO CAPS: 900.0000 mg | ORAL_CAPSULE | Freq: Three times a day (TID) | ORAL | 11 refills | Status: DC
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
## 2020-06-10 NOTE — Patient Instructions (Signed)
Piriformis stretch exercises

## 2020-06-16 ENCOUNTER — Telehealth: Payer: Self-pay | Admitting: Neurology

## 2020-06-16 NOTE — Telephone Encounter (Signed)
It looks like the patient never had the MRI Cervical spine. Do you want her to have those as well with the Brain and Lumbar?

## 2020-06-16 NOTE — Telephone Encounter (Signed)
Dr. Sater- please advise 

## 2020-06-17 NOTE — Telephone Encounter (Signed)
If covered we can go ahead and get that

## 2020-06-17 NOTE — Telephone Encounter (Signed)
Noted  spoke to the patient she states she will call me back later she is on vacation   [place] Numbers: NPR Ref # [id number]

## 2020-06-23 DIAGNOSIS — M9901 Segmental and somatic dysfunction of cervical region: Secondary | ICD-10-CM | POA: Diagnosis not present

## 2020-06-23 DIAGNOSIS — M9903 Segmental and somatic dysfunction of lumbar region: Secondary | ICD-10-CM | POA: Diagnosis not present

## 2020-06-23 DIAGNOSIS — M531 Cervicobrachial syndrome: Secondary | ICD-10-CM | POA: Diagnosis not present

## 2020-06-23 DIAGNOSIS — M9902 Segmental and somatic dysfunction of thoracic region: Secondary | ICD-10-CM | POA: Diagnosis not present

## 2020-06-28 ENCOUNTER — Other Ambulatory Visit: Payer: Self-pay | Admitting: *Deleted

## 2020-06-28 MED ORDER — GABAPENTIN 300 MG PO CAPS: 900.0000 mg | ORAL_CAPSULE | Freq: Three times a day (TID) | ORAL | 3 refills | Status: DC

## 2020-10-05 NOTE — Telephone Encounter (Signed)
x2 LVM  

## 2020-10-07 DIAGNOSIS — F319 Bipolar disorder, unspecified: Secondary | ICD-10-CM | POA: Diagnosis not present

## 2020-10-13 NOTE — Telephone Encounter (Signed)
Patient returned my call she is scheduled at [facility] for 10/27/20.

## 2020-10-19 DIAGNOSIS — M9901 Segmental and somatic dysfunction of cervical region: Secondary | ICD-10-CM | POA: Diagnosis not present

## 2020-10-19 DIAGNOSIS — M9902 Segmental and somatic dysfunction of thoracic region: Secondary | ICD-10-CM | POA: Diagnosis not present

## 2020-10-19 DIAGNOSIS — M531 Cervicobrachial syndrome: Secondary | ICD-10-CM | POA: Diagnosis not present

## 2020-10-19 DIAGNOSIS — M9903 Segmental and somatic dysfunction of lumbar region: Secondary | ICD-10-CM | POA: Diagnosis not present

## 2020-10-27 ENCOUNTER — Ambulatory Visit: Payer: BC Managed Care – PPO

## 2020-10-27 DIAGNOSIS — M542 Cervicalgia: Secondary | ICD-10-CM | POA: Diagnosis not present

## 2020-10-27 DIAGNOSIS — G930 Cerebral cysts: Secondary | ICD-10-CM

## 2020-10-27 DIAGNOSIS — M5412 Radiculopathy, cervical region: Secondary | ICD-10-CM

## 2020-10-27 DIAGNOSIS — M5416 Radiculopathy, lumbar region: Secondary | ICD-10-CM

## 2020-10-27 DIAGNOSIS — M79604 Pain in right leg: Secondary | ICD-10-CM

## 2020-10-27 DIAGNOSIS — R519 Headache, unspecified: Secondary | ICD-10-CM | POA: Diagnosis not present

## 2020-10-27 DIAGNOSIS — R269 Unspecified abnormalities of gait and mobility: Secondary | ICD-10-CM | POA: Diagnosis not present

## 2020-10-27 MED ORDER — GADOBUTROL 1 MMOL/ML IV SOLN
7.0000 mL | Freq: Once | INTRAVENOUS | Status: AC | PRN
  Administered 2020-10-27: 18:00:00 7 mL via INTRAVENOUS

## 2020-12-03 DIAGNOSIS — M9901 Segmental and somatic dysfunction of cervical region: Secondary | ICD-10-CM | POA: Diagnosis not present

## 2020-12-03 DIAGNOSIS — M9903 Segmental and somatic dysfunction of lumbar region: Secondary | ICD-10-CM | POA: Diagnosis not present

## 2020-12-03 DIAGNOSIS — M531 Cervicobrachial syndrome: Secondary | ICD-10-CM | POA: Diagnosis not present

## 2020-12-03 DIAGNOSIS — M9902 Segmental and somatic dysfunction of thoracic region: Secondary | ICD-10-CM | POA: Diagnosis not present

## 2020-12-06 DIAGNOSIS — M531 Cervicobrachial syndrome: Secondary | ICD-10-CM | POA: Diagnosis not present

## 2020-12-06 DIAGNOSIS — M9901 Segmental and somatic dysfunction of cervical region: Secondary | ICD-10-CM | POA: Diagnosis not present

## 2020-12-06 DIAGNOSIS — M9903 Segmental and somatic dysfunction of lumbar region: Secondary | ICD-10-CM | POA: Diagnosis not present

## 2020-12-06 DIAGNOSIS — M9902 Segmental and somatic dysfunction of thoracic region: Secondary | ICD-10-CM | POA: Diagnosis not present

## 2020-12-13 ENCOUNTER — Ambulatory Visit (INDEPENDENT_AMBULATORY_CARE_PROVIDER_SITE_OTHER): Payer: BC Managed Care – PPO | Admitting: Neurology

## 2020-12-13 ENCOUNTER — Encounter: Payer: Self-pay | Admitting: Neurology

## 2020-12-13 VITALS — BP 127/87 | HR 80 | Ht 64.0 in | Wt 145.0 lb

## 2020-12-13 DIAGNOSIS — M62838 Other muscle spasm: Secondary | ICD-10-CM | POA: Diagnosis not present

## 2020-12-13 DIAGNOSIS — G4489 Other headache syndrome: Secondary | ICD-10-CM | POA: Diagnosis not present

## 2020-12-13 DIAGNOSIS — E348 Other specified endocrine disorders: Secondary | ICD-10-CM

## 2020-12-13 DIAGNOSIS — M542 Cervicalgia: Secondary | ICD-10-CM | POA: Diagnosis not present

## 2020-12-13 MED ORDER — DIAZEPAM 10 MG PO TABS: 10.0000 mg | ORAL_TABLET | Freq: Two times a day (BID) | ORAL | 3 refills | Status: DC | PRN
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
## 2021-01-12 DIAGNOSIS — M9903 Segmental and somatic dysfunction of lumbar region: Secondary | ICD-10-CM | POA: Diagnosis not present

## 2021-01-12 DIAGNOSIS — M9901 Segmental and somatic dysfunction of cervical region: Secondary | ICD-10-CM | POA: Diagnosis not present

## 2021-01-12 DIAGNOSIS — M531 Cervicobrachial syndrome: Secondary | ICD-10-CM | POA: Diagnosis not present

## 2021-01-12 DIAGNOSIS — M9902 Segmental and somatic dysfunction of thoracic region: Secondary | ICD-10-CM | POA: Diagnosis not present

## 2021-01-19 DIAGNOSIS — M9902 Segmental and somatic dysfunction of thoracic region: Secondary | ICD-10-CM | POA: Diagnosis not present

## 2021-01-19 DIAGNOSIS — M531 Cervicobrachial syndrome: Secondary | ICD-10-CM | POA: Diagnosis not present

## 2021-01-19 DIAGNOSIS — M9903 Segmental and somatic dysfunction of lumbar region: Secondary | ICD-10-CM | POA: Diagnosis not present

## 2021-01-19 DIAGNOSIS — M9901 Segmental and somatic dysfunction of cervical region: Secondary | ICD-10-CM | POA: Diagnosis not present

## 2021-01-31 DIAGNOSIS — M9901 Segmental and somatic dysfunction of cervical region: Secondary | ICD-10-CM | POA: Diagnosis not present

## 2021-01-31 DIAGNOSIS — M9902 Segmental and somatic dysfunction of thoracic region: Secondary | ICD-10-CM | POA: Diagnosis not present

## 2021-01-31 DIAGNOSIS — M9903 Segmental and somatic dysfunction of lumbar region: Secondary | ICD-10-CM | POA: Diagnosis not present

## 2021-01-31 DIAGNOSIS — M531 Cervicobrachial syndrome: Secondary | ICD-10-CM | POA: Diagnosis not present

## 2021-02-08 ENCOUNTER — Telehealth: Payer: Self-pay | Admitting: *Deleted

## 2021-02-08 NOTE — Telephone Encounter (Signed)
Submitted PA gabapentin on CMM. Key: [id number]. Waiting on determination from [facility].

## 2021-02-08 NOTE — Telephone Encounter (Signed)
CaseId:[id number];Status:Approved;Review Type:Qty;Coverage Start Date:01/09/2021;Coverage End Date:02/08/2022;

## 2021-03-30 ENCOUNTER — Other Ambulatory Visit: Payer: Self-pay | Admitting: *Deleted

## 2021-03-30 MED ORDER — DIAZEPAM 10 MG PO TABS: 10.0000 mg | ORAL_TABLET | Freq: Two times a day (BID) | ORAL | 1 refills | Status: DC | PRN

## 2021-03-31 DIAGNOSIS — F319 Bipolar disorder, unspecified: Secondary | ICD-10-CM | POA: Diagnosis not present

## 2021-06-13 ENCOUNTER — Encounter: Payer: Self-pay | Admitting: Neurology

## 2021-06-13 ENCOUNTER — Ambulatory Visit: Payer: BC Managed Care – PPO | Admitting: Neurology

## 2021-07-29 DIAGNOSIS — M9903 Segmental and somatic dysfunction of lumbar region: Secondary | ICD-10-CM | POA: Diagnosis not present

## 2021-07-29 DIAGNOSIS — M9901 Segmental and somatic dysfunction of cervical region: Secondary | ICD-10-CM | POA: Diagnosis not present

## 2021-07-29 DIAGNOSIS — M9902 Segmental and somatic dysfunction of thoracic region: Secondary | ICD-10-CM | POA: Diagnosis not present

## 2021-07-29 DIAGNOSIS — M531 Cervicobrachial syndrome: Secondary | ICD-10-CM | POA: Diagnosis not present

## 2021-08-16 ENCOUNTER — Encounter: Payer: Self-pay | Admitting: Neurology

## 2021-08-16 ENCOUNTER — Ambulatory Visit (INDEPENDENT_AMBULATORY_CARE_PROVIDER_SITE_OTHER): Payer: BC Managed Care – PPO | Admitting: Neurology

## 2021-08-16 VITALS — BP 113/78 | HR 85 | Ht 64.0 in | Wt 153.5 lb

## 2021-08-16 DIAGNOSIS — M542 Cervicalgia: Secondary | ICD-10-CM

## 2021-08-16 DIAGNOSIS — M62838 Other muscle spasm: Secondary | ICD-10-CM | POA: Diagnosis not present

## 2021-08-16 DIAGNOSIS — M791 Myalgia, unspecified site: Secondary | ICD-10-CM

## 2021-08-16 DIAGNOSIS — G5621 Lesion of ulnar nerve, right upper limb: Secondary | ICD-10-CM

## 2021-08-16 DIAGNOSIS — G4489 Other headache syndrome: Secondary | ICD-10-CM | POA: Diagnosis not present

## 2021-08-16 DIAGNOSIS — M9903 Segmental and somatic dysfunction of lumbar region: Secondary | ICD-10-CM | POA: Diagnosis not present

## 2021-08-16 DIAGNOSIS — E348 Other specified endocrine disorders: Secondary | ICD-10-CM

## 2021-08-16 DIAGNOSIS — M531 Cervicobrachial syndrome: Secondary | ICD-10-CM | POA: Diagnosis not present

## 2021-08-16 DIAGNOSIS — M9902 Segmental and somatic dysfunction of thoracic region: Secondary | ICD-10-CM | POA: Diagnosis not present

## 2021-08-16 DIAGNOSIS — M9901 Segmental and somatic dysfunction of cervical region: Secondary | ICD-10-CM | POA: Diagnosis not present

## 2021-08-16 MED ORDER — DIAZEPAM 10 MG PO TABS: 10.0000 mg | ORAL_TABLET | Freq: Two times a day (BID) | ORAL | 1 refills | Status: DC | PRN

## 2021-08-16 MED ORDER — GABAPENTIN 300 MG PO CAPS: 900.0000 mg | ORAL_CAPSULE | Freq: Three times a day (TID) | ORAL | 3 refills | Status: DC
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
## 2021-09-08 DIAGNOSIS — F319 Bipolar disorder, unspecified: Secondary | ICD-10-CM | POA: Diagnosis not present

## 2021-10-21 DIAGNOSIS — F319 Bipolar disorder, unspecified: Secondary | ICD-10-CM | POA: Diagnosis not present

## 2021-11-08 LAB — BASIC METABOLIC PANEL
BUN: 19 (ref 4–21)
CO2: 27 — AB (ref 13–22)
Chloride: 101 (ref 99–108)
Creatinine: 0.9 (ref 0.5–1.1)
Glucose: 86
Potassium: 5 (ref 3.4–5.3)
Sodium: 141 (ref 137–147)

## 2021-11-08 LAB — HEPATIC FUNCTION PANEL
ALT: 18 (ref 7–35)
AST: 16 (ref 13–35)
Alkaline Phosphatase: 75 (ref 25–125)
Bilirubin, Total: 0.3

## 2021-11-08 LAB — HEMOGLOBIN A1C: Hemoglobin A1C: 5.7

## 2021-11-08 LAB — COMPREHENSIVE METABOLIC PANEL
Albumin: 4.7 (ref 3.5–5.0)
Calcium: 10.4 (ref 8.7–10.7)
Globulin: 2.3

## 2021-11-08 LAB — HM MAMMOGRAPHY

## 2021-11-08 LAB — LIPID PANEL
Cholesterol: 302 — AB (ref 0–200)
HDL: 69 (ref 35–70)
LDL Cholesterol: 203
Triglycerides: 164 — AB (ref 40–160)

## 2021-11-08 LAB — TSH: TSH: 2.37 (ref 0.41–5.90)

## 2021-11-08 LAB — HM PAP SMEAR

## 2021-11-08 LAB — RESULTS CONSOLE HPV: CHL HPV: NEGATIVE

## 2021-11-08 LAB — VITAMIN D 25 HYDROXY (VIT D DEFICIENCY, FRACTURES): Vit D, 25-Hydroxy: 19.2

## 2022-02-02 ENCOUNTER — Telehealth: Payer: Self-pay | Admitting: *Deleted

## 2022-02-02 NOTE — Telephone Encounter (Signed)
Initiated PA gabapentin on CMM. Key: [id number]. Waiting on clinical questions to become available to move forward with PA.  ?

## 2022-02-07 NOTE — Telephone Encounter (Signed)
Questions still not available from insurance yet. I asked CMM about this. They advised me to renew key. New key: [id number]. Waiting on questions from [facility]. ?

## 2022-02-08 NOTE — Telephone Encounter (Signed)
Questions still not available. I called [facility] at [phone]. Spoke w/ Duanne Moron. Submitted PA over the phone.  ?#810/90days. 01/09/22-02/08/23. PA approval # [id number].  ?

## 2022-02-16 ENCOUNTER — Encounter: Payer: Self-pay | Admitting: *Deleted

## 2022-02-16 ENCOUNTER — Ambulatory Visit (INDEPENDENT_AMBULATORY_CARE_PROVIDER_SITE_OTHER): Payer: BC Managed Care – PPO | Admitting: Family Medicine

## 2022-02-16 ENCOUNTER — Encounter: Payer: Self-pay | Admitting: Family Medicine

## 2022-02-16 ENCOUNTER — Telehealth: Payer: Self-pay | Admitting: Family Medicine

## 2022-02-16 VITALS — BP 100/64 | HR 92 | Ht 64.0 in | Wt 148.0 lb

## 2022-02-16 DIAGNOSIS — R202 Paresthesia of skin: Secondary | ICD-10-CM

## 2022-02-16 DIAGNOSIS — M791 Myalgia, unspecified site: Secondary | ICD-10-CM | POA: Diagnosis not present

## 2022-02-16 DIAGNOSIS — M62838 Other muscle spasm: Secondary | ICD-10-CM

## 2022-02-16 DIAGNOSIS — G4489 Other headache syndrome: Secondary | ICD-10-CM | POA: Diagnosis not present

## 2022-02-16 DIAGNOSIS — R200 Anesthesia of skin: Secondary | ICD-10-CM | POA: Diagnosis not present

## 2022-02-16 NOTE — Patient Instructions (Signed)
Below is our plan: ? ?We will continue gabapentin up to 900mg  three times daily and diazepam 10mg  up to twice daily. Consider discussion of duloxetine with PCP. Consider integrative medicine like [facility] or [facility].  ? ?Please make sure you are staying well hydrated. I recommend 50-60 ounces daily. Well balanced diet and regular exercise encouraged. Consistent sleep schedule with 6-8 hours recommended.  ? ?Please continue follow up with care team as directed.  ? ?Follow up with Dr in 6 months  ? ?You may receive a survey regarding today's visit. I encourage you to leave honest feed back as I do use this information to improve patient care. Thank you for seeing me today!  ? ? ?
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
## 2022-02-16 NOTE — Telephone Encounter (Signed)
I called pt. Relayed AL,NP note. She states she spoke w/ PCP who plans to keep her on fluoxetine.  ? ?Would like Shawnie Dapper, NP to write duloxetine. PCP will adjust fluoxetine dose depending on what is called in for duloxetine. Pt requesting Amy send in rx for duloxetine. ?

## 2022-02-16 NOTE — Telephone Encounter (Signed)
Pt has called to inform Amy, NP that she spoke with her PCP and was given the ok for the duloxetine to be added and pt was told that her FLUoxetine (PROZAC) 40 MG capsule, would be adjusted ?

## 2022-02-22 NOTE — Telephone Encounter (Signed)
Called pt back. Read her mychart message that Amy sent her on 02/16/22: ? ?"Hey there, Malia. We discussed my hesitation of witting duloxetine while you are taking fluoxetine. If your primary care provider would like to write this for you that is great but while you are taking fluoxetine, I will not write duloxetine. These are similar medicaitons in a similar class and I do not feel comfortable with you being on both. If your PCP weans fluoxetine and you are doing well, we will consider witting duloxetine for neuropathic pain but not while taking fluoxetine. Hope this makes sense. Have a great weekend.  ?  ?Amy" ? ? ?She expressed dissatisfaction. I again reiterated that she should contact PCP about weaning off fluoxetine. Once she has done this Amy can then consider prescribing duloxetine. We do not want her on both. She will speak w/ PCP and call back once she knows plan/has weaned off med.  ? ?She also wanted next f/u to be w/ Dr. Epimenio Foot. I rescheduled appt to be w/ Dr. Epimenio Foot 09/20/22 at 11am. ?

## 2022-02-22 NOTE — Telephone Encounter (Signed)
Pt states she would like to be prescribed duloxetine. Pt states PCP gave her permission to take the  FLUoxetine (PROZAC) 40 MG capsule, while taking duloxetine,PCP would adjust accordingly. ? Pt states she is willing to stop taking  FLUoxetine (PROZAC) 40 MG capsule,  Would like a call back. ? ?[phone] ?

## 2022-02-23 NOTE — Telephone Encounter (Signed)
Pt has called and the my chart message from 04-13 from Amy was read to her.  Pt states she spoke with her PCP and was told that she could go ahead and start on the duloxetine since according to pt she has been off of FLUoxetine (PROZAC) 40 MG capsule, for 5 days.Pt states she asked if Emma,RN wanted the Dr(PCP) to fax something in stating this and she was told no.  Phone rep spoke with an RN and was told to have pt to have pt's PCP to fax in  orders  to [phone] attention Amy.  This message was relayed to pt. ?

## 2022-02-27 ENCOUNTER — Other Ambulatory Visit: Payer: Self-pay

## 2022-02-27 MED ORDER — DIAZEPAM 10 MG PO TABS: 10.0000 mg | ORAL_TABLET | Freq: Two times a day (BID) | ORAL | 1 refills | Status: DC | PRN

## 2022-02-27 NOTE — Progress Notes (Signed)
Last OV was on 02/16/22.  ?Next OV is scheduled for 09/20/22.  ?Last RX was written on 11/23/21 for 180 tabs.  ? ?[place] [facility] Database has been reviewed.  ?

## 2022-03-08 ENCOUNTER — Other Ambulatory Visit: Payer: Self-pay | Admitting: *Deleted

## 2022-03-08 MED ORDER — DIAZEPAM 10 MG PO TABS: 10.0000 mg | ORAL_TABLET | Freq: Two times a day (BID) | ORAL | 1 refills | Status: DC | PRN

## 2022-09-10 ENCOUNTER — Other Ambulatory Visit: Payer: Self-pay

## 2022-09-10 ENCOUNTER — Emergency Department (HOSPITAL_COMMUNITY)
Admission: EM | Admit: 2022-09-10 | Discharge: 2022-09-11 | Disposition: A | Discharge status: Home / Self Care | Payer: BC Managed Care – PPO | Attending: Emergency Medicine | Admitting: Emergency Medicine

## 2022-09-10 ENCOUNTER — Emergency Department (HOSPITAL_COMMUNITY): Payer: BC Managed Care – PPO

## 2022-09-10 DIAGNOSIS — M5431 Sciatica, right side: Secondary | ICD-10-CM | POA: Insufficient documentation

## 2022-09-10 DIAGNOSIS — M545 Low back pain, unspecified: Secondary | ICD-10-CM | POA: Diagnosis present

## 2022-09-10 LAB — BASIC METABOLIC PANEL
Anion gap: 12 (ref 5–15)
BUN: 15 mg/dL (ref 6–20)
CO2: 24 mmol/L (ref 22–32)
Calcium: 9.6 mg/dL (ref 8.9–10.3)
Chloride: 103 mmol/L (ref 98–111)
Creatinine, Ser: 0.98 mg/dL (ref 0.44–1.00)
GFR, Estimated: >60 mL/min (ref >60)
Glucose, Bld: 120 mg/dL — ABNORMAL HIGH (ref 70–99)
Potassium: 3.9 mmol/L (ref 3.5–5.1)
Sodium: 139 mmol/L (ref 135–145)

## 2022-09-10 LAB — CBC WITH DIFFERENTIAL/PLATELET
Abs Immature Granulocytes: 0.12 10*3/uL — ABNORMAL HIGH (ref 0.00–0.07)
Basophils Absolute: 0 10*3/uL (ref 0.0–0.1)
Basophils Relative: 0 %
Eosinophils Absolute: 0 10*3/uL (ref 0.0–0.5)
Eosinophils Relative: 0 %
HCT: 40.4 % (ref 36.0–46.0)
Hemoglobin: 13.4 g/dL (ref 12.0–15.0)
Immature Granulocytes: 1 %
Lymphocytes Relative: 13 %
Lymphs Abs: 1.7 10*3/uL (ref 0.7–4.0)
MCH: 30.9 pg (ref 26.0–34.0)
MCHC: 33.2 g/dL (ref 30.0–36.0)
MCV: 93.3 fL (ref 80.0–100.0)
Monocytes Absolute: 0.4 10*3/uL (ref 0.1–1.0)
Monocytes Relative: 3 %
Neutro Abs: 11.1 10*3/uL — ABNORMAL HIGH (ref 1.7–7.7)
Neutrophils Relative %: 83 %
Platelets: 349 10*3/uL (ref 150–400)
RBC: 4.33 MIL/uL (ref 3.87–5.11)
RDW: 12.2 % (ref 11.5–15.5)
WBC: 13.4 10*3/uL — ABNORMAL HIGH (ref 4.0–10.5)
nRBC: 0 % (ref 0.0–0.2)

## 2022-09-10 LAB — I-STAT BETA HCG BLOOD, ED (MC, WL, AP ONLY): I-stat hCG, quantitative: <5 m[IU]/mL (ref <5)

## 2022-09-10 NOTE — ED Provider Triage Note (Signed)
Emergency Medicine Provider Triage Evaluation Note  Alison Bradley , a 51 y.o. female presents with her husband for evaluation of low back pain, incontinence and right lower extremity weakness.  She reports low back pain incontinences been intermittent over the past 1 month but worsening.  She reports today she had several episodes of urinary incontinence.  She reports right leg weakness and inability to ambulate over the past day as well.  She reports this caused her to fall down the scant stairs, she reports that on around 6 stairs.  She denies any injury from her fall but does report some worsening back pain.  Review of Systems  Positive: Low back pain, right lower extremity weakness, incontinence Negative: Fever, chills, IV drug use, abdominal pain, nausea, vomiting, dysuria/hematuria, chest pain/shortness of breath, head injury/loss conscious, blood thinner use, neck pain or any additional concerns.  Physical Exam  BP 100/69   Pulse 90   Temp 97.7 F (36.5 C)   Resp 14   Ht 5\' 4"  (1.626 m)   Wt 68 kg   SpO2 98%   BMI 25.75 kg/m  Gen:   Awake, no distress   Resp:  Normal effort  MSK:   No midline spinal tenderness palpation.  No crepitus deformity spine.  Diffuse paralumbar muscular tenderness palpation.  No SI joint tenderness palpation. Other:  Abdomen soft nontender without peritoneal signs.  No evidence of injury to the abdomen or chest.  No hemotympanum.  No raccoon eyes or battle signs.  No tenderness palpation of the scalp.  No trismus.  2/5 strength with right EHL, dorsi/plantarflexion, knees flexion/extension and hip flexion when compared to left.  Sensation slightly diminished in all distributions right compared to left.  Negative clonus test bilaterally.  Medical Decision Making  Medically screening exam initiated at 3:21 PM.  Appropriate orders placed.  Alison Bradley was informed that the remainder of the evaluation will be completed by another provider, this initial triage  assessment does not replace that evaluation, and the importance of remaining in the ED until their evaluation is complete.  Patient denies history of pacemaker or magnetic foreign bodies contraindicate MRI.  MRI of the lumbar and thoracic spine ordered for evaluation of cauda equina.  Note: Portions of this report may have been transcribed using voice recognition software. Every effort was made to ensure accuracy; however, inadvertent computerized transcription errors may still be present.    Deliah Boston, PA-C 09/10/22 1524

## 2022-09-10 NOTE — ED Triage Notes (Signed)
Pt c/o R back, leg pain & incontinence x1mo, new-onset R leg numbness. Pt ambulatory w crutches.

## 2022-09-11 ENCOUNTER — Emergency Department (HOSPITAL_COMMUNITY): Payer: BC Managed Care – PPO

## 2022-09-11 MED ORDER — PREDNISONE 20 MG PO TABS: 40.0000 mg | ORAL_TABLET | Freq: Every day | ORAL | 0 refills | Status: AC

## 2022-09-11 MED ORDER — PREDNISONE 20 MG PO TABS
60.0000 mg | ORAL_TABLET | Freq: Once | ORAL | Status: AC
  Administered 2022-09-11: 60 mg via ORAL
  Filled 2022-09-11: qty 3

## 2022-09-11 MED ORDER — GADOBUTROL 1 MMOL/ML IV SOLN
6.0000 mL | Freq: Once | INTRAVENOUS | Status: AC | PRN
  Administered 2022-09-11: 6 mL via INTRAVENOUS

## 2022-09-11 MED ORDER — MORPHINE SULFATE (PF) 4 MG/ML IV SOLN
4.0000 mg | Freq: Once | INTRAVENOUS | Status: AC
  Administered 2022-09-11: 4 mg via INTRAVENOUS
  Filled 2022-09-11: qty 1

## 2022-09-11 NOTE — ED Notes (Signed)
This RN reviewed discharge instructions with pt. Pt verbalized understanding and had no further questions. VSS upon discharge 

## 2022-09-11 NOTE — ED Notes (Signed)
Patient transported to MRI 
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
## 2022-09-11 NOTE — Discharge Instructions (Signed)
You were evaluated in the Emergency Department and after careful evaluation, we did not find any emergent condition requiring admission or further testing in the hospital.  Your exam/testing today is overall reassuring.  Recommend continued follow-up with your neurologist to discuss your symptoms.  Use the prednisone medication as directed to treat possible sciatica.  Please return to the Emergency Department if you experience any worsening of your condition.   Thank you for allowing [place] to be a part of your care.

## 2022-09-13 ENCOUNTER — Telehealth: Payer: Self-pay | Admitting: Neurology

## 2022-09-13 NOTE — Telephone Encounter (Signed)
Received a call from the patient stating for the past month she has been intermittent urine and bowel incontinence but this past week the incontinence has been constant and she new severe right leg pain and numbness. She presented to the ED on the 6, MRI brain and lumbar spine unrevealing, she was given PO steroids.  She followed up with her PCP who wanted to do 3 days IV steroid for possible transverse myelitis. She wanted to know if this is safe. She has an appointment with Dr. Epimenio Foot on 11/15.  I have informed her that both her MRI have been stable and that I will forward the message to Dr. Epimenio Foot for review. She voices understanding.   Dr. Teresa Coombs

## 2022-09-14 NOTE — Telephone Encounter (Addendum)
Tried calling pt at [phone]. Mailbox full, unable to leave message.   Tried calling pt at [phone]. LVM asking she call. She can also check her mychart message we sent her and send any questions she may have back via mychart message if this is easier for her.

## 2022-09-14 NOTE — Telephone Encounter (Signed)
Tried calling pt, went to VM and could not LVM

## 2022-09-18 ENCOUNTER — Ambulatory Visit: Payer: BC Managed Care – PPO | Admitting: Family Medicine
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
## 2022-09-20 ENCOUNTER — Encounter: Payer: Self-pay | Admitting: Neurology

## 2022-09-20 ENCOUNTER — Ambulatory Visit (INDEPENDENT_AMBULATORY_CARE_PROVIDER_SITE_OTHER): Payer: BC Managed Care – PPO | Admitting: Neurology

## 2022-09-20 VITALS — BP 99/81 | HR 114 | Ht 64.0 in | Wt 144.0 lb

## 2022-09-20 DIAGNOSIS — R29898 Other symptoms and signs involving the musculoskeletal system: Secondary | ICD-10-CM

## 2022-09-20 DIAGNOSIS — E348 Other specified endocrine disorders: Secondary | ICD-10-CM

## 2022-09-20 DIAGNOSIS — M62838 Other muscle spasm: Secondary | ICD-10-CM

## 2022-09-20 DIAGNOSIS — F339 Major depressive disorder, recurrent, unspecified: Secondary | ICD-10-CM

## 2022-09-20 DIAGNOSIS — R200 Anesthesia of skin: Secondary | ICD-10-CM | POA: Diagnosis not present

## 2022-09-20 DIAGNOSIS — R39198 Other difficulties with micturition: Secondary | ICD-10-CM

## 2022-09-20 DIAGNOSIS — R202 Paresthesia of skin: Secondary | ICD-10-CM

## 2022-09-20 MED ORDER — HYOSCYAMINE SULFATE 0.125 MG PO TABS: ORAL_TABLET | ORAL | 3 refills | Status: DC

## 2022-09-25 ENCOUNTER — Telehealth: Payer: Self-pay | Admitting: Neurology

## 2022-09-25 ENCOUNTER — Other Ambulatory Visit: Payer: Self-pay | Admitting: *Deleted

## 2022-09-25 DIAGNOSIS — M62838 Other muscle spasm: Secondary | ICD-10-CM

## 2022-09-25 DIAGNOSIS — F339 Major depressive disorder, recurrent, unspecified: Secondary | ICD-10-CM

## 2022-09-25 DIAGNOSIS — R202 Paresthesia of skin: Secondary | ICD-10-CM

## 2022-09-25 DIAGNOSIS — G5621 Lesion of ulnar nerve, right upper limb: Secondary | ICD-10-CM

## 2022-09-25 DIAGNOSIS — E348 Other specified endocrine disorders: Secondary | ICD-10-CM

## 2022-09-25 DIAGNOSIS — R29898 Other symptoms and signs involving the musculoskeletal system: Secondary | ICD-10-CM

## 2022-09-25 DIAGNOSIS — G4489 Other headache syndrome: Secondary | ICD-10-CM

## 2022-09-25 DIAGNOSIS — M542 Cervicalgia: Secondary | ICD-10-CM

## 2022-09-25 DIAGNOSIS — R39198 Other difficulties with micturition: Secondary | ICD-10-CM

## 2022-09-25 DIAGNOSIS — M791 Myalgia, unspecified site: Secondary | ICD-10-CM

## 2022-09-25 NOTE — Telephone Encounter (Signed)
Referral for Neurology faxed to [facility] to see Dr. Jamelle Rushing.Phone:[phone] Fax:[phone].

## 2022-09-26 ENCOUNTER — Telehealth: Payer: Self-pay | Admitting: Neurology

## 2022-09-26 NOTE — Telephone Encounter (Signed)
Called pt, VM box full, sent mychart msg informing pt to call back to schedule MRI

## 2022-09-26 NOTE — Telephone Encounter (Signed)
Pt is calling. Requesting Home Health Pt. Stated someone called her today and want her to come to Pt tomorrow. Pt states she is having very bad diarrhea and can't leave the house.

## 2022-09-26 NOTE — Telephone Encounter (Signed)
Called and spoke with pt. She would prefer to do home PT d/t incontinence of bowel.  I offered to place referral and advised it may be a couple weeks before getting scheduled d/t holiday. Husband preferred she keep outpt PT appt tomorrow at 4pm. They will see how this goes. They will call back if they feel switch is needed in future to home PT.

## 2022-09-27 ENCOUNTER — Ambulatory Visit (HOSPITAL_BASED_OUTPATIENT_CLINIC_OR_DEPARTMENT_OTHER): Payer: BC Managed Care – PPO | Attending: Neurology | Admitting: Physical Therapy

## 2022-09-27 ENCOUNTER — Other Ambulatory Visit: Payer: Self-pay

## 2022-09-27 DIAGNOSIS — R29898 Other symptoms and signs involving the musculoskeletal system: Secondary | ICD-10-CM | POA: Insufficient documentation

## 2022-09-27 DIAGNOSIS — R296 Repeated falls: Secondary | ICD-10-CM | POA: Diagnosis present

## 2022-09-27 DIAGNOSIS — R2689 Other abnormalities of gait and mobility: Secondary | ICD-10-CM | POA: Diagnosis not present

## 2022-09-27 DIAGNOSIS — R202 Paresthesia of skin: Secondary | ICD-10-CM | POA: Diagnosis not present

## 2022-09-27 DIAGNOSIS — R200 Anesthesia of skin: Secondary | ICD-10-CM | POA: Diagnosis not present

## 2022-09-27 DIAGNOSIS — M5417 Radiculopathy, lumbosacral region: Secondary | ICD-10-CM | POA: Diagnosis present
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
## 2022-10-05 ENCOUNTER — Other Ambulatory Visit: Payer: Self-pay | Admitting: Internal Medicine

## 2022-10-05 DIAGNOSIS — N179 Acute kidney failure, unspecified: Secondary | ICD-10-CM

## 2022-10-05 DIAGNOSIS — R109 Unspecified abdominal pain: Secondary | ICD-10-CM

## 2022-10-05 DIAGNOSIS — M549 Dorsalgia, unspecified: Secondary | ICD-10-CM

## 2022-10-09 ENCOUNTER — Encounter (HOSPITAL_BASED_OUTPATIENT_CLINIC_OR_DEPARTMENT_OTHER): Payer: BC Managed Care – PPO | Admitting: Physical Therapy

## 2022-10-10 ENCOUNTER — Other Ambulatory Visit: Payer: Self-pay | Admitting: Internal Medicine

## 2022-10-10 DIAGNOSIS — R109 Unspecified abdominal pain: Secondary | ICD-10-CM

## 2022-10-13 ENCOUNTER — Encounter (HOSPITAL_BASED_OUTPATIENT_CLINIC_OR_DEPARTMENT_OTHER): Payer: BC Managed Care – PPO | Admitting: Physical Therapy

## 2022-10-13 ENCOUNTER — Other Ambulatory Visit: Payer: BC Managed Care – PPO

## 2022-10-16 ENCOUNTER — Encounter (HOSPITAL_BASED_OUTPATIENT_CLINIC_OR_DEPARTMENT_OTHER): Payer: BC Managed Care – PPO | Admitting: Physical Therapy

## 2022-10-16 ENCOUNTER — Other Ambulatory Visit: Payer: BC Managed Care – PPO

## 2022-10-18 ENCOUNTER — Encounter (HOSPITAL_BASED_OUTPATIENT_CLINIC_OR_DEPARTMENT_OTHER): Payer: BC Managed Care – PPO | Admitting: Physical Therapy

## 2022-10-18 ENCOUNTER — Other Ambulatory Visit: Payer: BC Managed Care – PPO

## 2022-10-23 ENCOUNTER — Encounter (HOSPITAL_BASED_OUTPATIENT_CLINIC_OR_DEPARTMENT_OTHER): Payer: BC Managed Care – PPO | Admitting: Physical Therapy

## 2022-10-27 ENCOUNTER — Encounter (HOSPITAL_BASED_OUTPATIENT_CLINIC_OR_DEPARTMENT_OTHER): Payer: BC Managed Care – PPO | Admitting: Physical Therapy

## 2022-10-31 ENCOUNTER — Encounter (HOSPITAL_BASED_OUTPATIENT_CLINIC_OR_DEPARTMENT_OTHER): Payer: BC Managed Care – PPO | Admitting: Physical Therapy

## 2022-11-03 ENCOUNTER — Encounter (HOSPITAL_BASED_OUTPATIENT_CLINIC_OR_DEPARTMENT_OTHER): Payer: BC Managed Care – PPO | Admitting: Physical Therapy

## 2022-11-13 ENCOUNTER — Other Ambulatory Visit: Payer: BC Managed Care – PPO

## 2022-12-25 ENCOUNTER — Ambulatory Visit: Payer: BC Managed Care – PPO | Admitting: Neurology

## 2023-02-05 HISTORY — PX: ROTATOR CUFF REPAIR: SHX139

## 2023-02-06 ENCOUNTER — Encounter: Payer: Self-pay | Admitting: Neurology

## 2023-02-06 ENCOUNTER — Ambulatory Visit (INDEPENDENT_AMBULATORY_CARE_PROVIDER_SITE_OTHER): Payer: BC Managed Care – PPO | Admitting: Neurology

## 2023-02-06 VITALS — BP 139/89 | HR 89 | Ht 64.0 in | Wt 157.8 lb

## 2023-02-06 DIAGNOSIS — E348 Other specified endocrine disorders: Secondary | ICD-10-CM | POA: Diagnosis not present

## 2023-02-06 DIAGNOSIS — R202 Paresthesia of skin: Secondary | ICD-10-CM

## 2023-02-06 DIAGNOSIS — R292 Abnormal reflex: Secondary | ICD-10-CM

## 2023-02-06 DIAGNOSIS — M62838 Other muscle spasm: Secondary | ICD-10-CM | POA: Diagnosis not present

## 2023-02-06 DIAGNOSIS — M791 Myalgia, unspecified site: Secondary | ICD-10-CM

## 2023-02-06 DIAGNOSIS — R200 Anesthesia of skin: Secondary | ICD-10-CM

## 2023-02-06 MED ORDER — GABAPENTIN 300 MG PO CAPS: 900.0000 mg | ORAL_CAPSULE | Freq: Three times a day (TID) | ORAL | 3 refills | Status: AC

## 2023-02-06 MED ORDER — DIAZEPAM 10 MG PO TABS: 10.0000 mg | ORAL_TABLET | Freq: Two times a day (BID) | ORAL | 1 refills | Status: AC | PRN
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
## 2023-08-16 ENCOUNTER — Ambulatory Visit: Payer: BC Managed Care – PPO | Admitting: Neurology

## 2023-10-01 ENCOUNTER — Ambulatory Visit (INDEPENDENT_AMBULATORY_CARE_PROVIDER_SITE_OTHER): Payer: BC Managed Care – PPO | Admitting: Neurology

## 2023-10-01 ENCOUNTER — Encounter: Payer: Self-pay | Admitting: Neurology

## 2023-10-01 VITALS — BP 147/96 | HR 101 | Ht 63.0 in | Wt 153.5 lb

## 2023-10-01 DIAGNOSIS — R292 Abnormal reflex: Secondary | ICD-10-CM | POA: Diagnosis not present

## 2023-10-01 DIAGNOSIS — R202 Paresthesia of skin: Secondary | ICD-10-CM

## 2023-10-01 DIAGNOSIS — M791 Myalgia, unspecified site: Secondary | ICD-10-CM | POA: Diagnosis not present

## 2023-10-01 DIAGNOSIS — R29898 Other symptoms and signs involving the musculoskeletal system: Secondary | ICD-10-CM

## 2023-10-01 DIAGNOSIS — R200 Anesthesia of skin: Secondary | ICD-10-CM | POA: Diagnosis not present

## 2023-10-01 DIAGNOSIS — M62838 Other muscle spasm: Secondary | ICD-10-CM
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
## 2024-06-27 ENCOUNTER — Telehealth: Payer: Self-pay | Admitting: Neurology

## 2024-06-27 NOTE — Telephone Encounter (Signed)
 Due to a conflict, pt had to cx appointment, will call back to r/s

## 2024-06-30 ENCOUNTER — Ambulatory Visit: Payer: BC Managed Care – PPO | Admitting: Neurology
